# Patient Record
Sex: Male | Born: 1975 | Race: Black or African American | Hispanic: No | Marital: Married | Smoking: Current every day smoker
Health system: Southern US, Community
[De-identification: ages and names within clinical notes are randomized; demographics above are authoritative.]

## PROBLEM LIST (undated history)

## (undated) DIAGNOSIS — N200 Calculus of kidney: Secondary | ICD-10-CM

---

## 2007-04-02 ENCOUNTER — Emergency Department (HOSPITAL_COMMUNITY): Admission: EM | Admit: 2007-04-02 | Discharge: 2007-04-02 | Payer: Self-pay | Admitting: Emergency Medicine

## 2010-05-25 ENCOUNTER — Emergency Department (HOSPITAL_COMMUNITY)
Admission: EM | Admit: 2010-05-25 | Discharge: 2010-05-25 | Payer: Self-pay | Source: Home / Self Care | Admitting: Emergency Medicine

## 2011-03-27 ENCOUNTER — Encounter: Payer: Self-pay | Admitting: *Deleted

## 2011-03-27 ENCOUNTER — Emergency Department (HOSPITAL_COMMUNITY): Payer: Self-pay

## 2011-03-27 ENCOUNTER — Emergency Department (HOSPITAL_COMMUNITY)
Admission: EM | Admit: 2011-03-27 | Discharge: 2011-03-27 | Disposition: A | Discharge status: Home / Self Care | Payer: Self-pay | Attending: Emergency Medicine | Admitting: Emergency Medicine

## 2011-03-27 DIAGNOSIS — R109 Unspecified abdominal pain: Secondary | ICD-10-CM | POA: Insufficient documentation

## 2011-03-27 DIAGNOSIS — N23 Unspecified renal colic: Secondary | ICD-10-CM

## 2011-03-27 DIAGNOSIS — N201 Calculus of ureter: Secondary | ICD-10-CM | POA: Insufficient documentation

## 2011-03-27 HISTORY — DX: Calculus of kidney: N20.0

## 2011-03-27 LAB — URINALYSIS, ROUTINE W REFLEX MICROSCOPIC
Bilirubin Urine: NEGATIVE
Nitrite: NEGATIVE
Protein, ur: NEGATIVE mg/dL
Specific Gravity, Urine: 1.015 (ref 1.005–1.030)
Urobilinogen, UA: 1 mg/dL (ref 0.0–1.0)

## 2011-03-27 LAB — URINE MICROSCOPIC-ADD ON

## 2011-03-27 MED ORDER — KETOROLAC TROMETHAMINE 60 MG/2ML IM SOLN
60.0000 mg | Freq: Once | INTRAMUSCULAR | Status: AC
  Administered 2011-03-27: 60 mg via INTRAMUSCULAR
  Filled 2011-03-27: qty 2

## 2011-03-27 MED ORDER — OXYCODONE-ACETAMINOPHEN 5-325 MG PO TABS
2.0000 | ORAL_TABLET | Freq: Once | ORAL | Status: AC
  Administered 2011-03-27: 2 via ORAL
  Filled 2011-03-27: qty 2

## 2011-03-27 MED ORDER — OXYCODONE-ACETAMINOPHEN 5-325 MG PO TABS: 2.0000 | ORAL_TABLET | ORAL | Status: AC | PRN

## 2011-03-27 MED ORDER — CIPROFLOXACIN HCL 500 MG PO TABS: 500.0000 mg | ORAL_TABLET | Freq: Two times a day (BID) | ORAL | Status: AC

## 2011-03-27 NOTE — ED Notes (Signed)
Patient transported to CT 

## 2011-03-27 NOTE — ED Notes (Signed)
Medicated as ordered--Rates pain a 1 on 1-10 scale prior to meds.

## 2011-03-27 NOTE — ED Notes (Signed)
C/o rt flank, started yesterday, no urinary symptoms, no n/v

## 2011-03-27 NOTE — ED Provider Notes (Signed)
History     CSN: [id number] Arrival date & time: 03/27/2011 11:01 AM   First MD Initiated Contact with Patient 03/27/11 1115      Chief Complaint  Patient presents with  . Flank Pain    right flank pain    (Consider location/radiation/quality/duration/timing/severity/associated sxs/prior treatment) HPI Patient with intermittent left flank pain since yesterday.  Patient states pain is sharp 10/10, now 0/10.  Radiates from low back to flank.  Pain is intermittent last 5-7 minutes.  Patient with similar symptoms with kidney stone in 2007.  No hematuria, or frequency, with some hesitancy.  No nausea or vomiting, fever, diarrhea.     Past Medical History  Diagnosis Date  . Kidney stones     History reviewed. No pertinent past surgical history.  No family history on file.  History  Substance Use Topics  . Smoking status: Not on file  . Smokeless tobacco: Current User  . Alcohol Use: Yes      Review of Systems  All other systems reviewed and are negative.    Allergies  Review of patient's allergies indicates no known allergies.  Home Medications   Current Outpatient Rx  Name Route Sig Dispense Refill  . BISACODYL 5 MG PO TBEC Oral Take 5 mg by mouth daily as needed.      Marland Kitchen SIMETHICONE 125 MG PO CHEW Oral Chew 125 mg by mouth every 6 (six) hours as needed.        BP 124/76  Pulse 78  Temp(Src) 98.7 F (37.1 C) (Oral)  Resp 16  SpO2 99%  Physical Exam  Nursing note and vitals reviewed. Constitutional: He is oriented to person, place, and time. He appears well-developed and well-nourished.  HENT:  Head: Normocephalic and atraumatic.  Eyes: Conjunctivae and EOM are normal. Pupils are equal, round, and reactive to light.  Neck: Normal range of motion. Neck supple.  Cardiovascular: Normal rate and normal heart sounds.   Pulmonary/Chest: Effort normal and breath sounds normal.  Abdominal: Soft. Bowel sounds are normal.  Musculoskeletal: Normal range of motion.   Neurological: He is alert and oriented to person, place, and time.  Skin: Skin is warm and dry. No erythema.  Psychiatric: He has a normal mood and affect.    ED Course  Procedures (including critical care time)  Labs Reviewed - No data to display No results found.   No diagnosis found.    MDM     Patient remains pain free here.       Hilario Quarry, MD 03/27/11 [phone]

## 2011-07-04 ENCOUNTER — Emergency Department (HOSPITAL_COMMUNITY)
Admission: EM | Admit: 2011-07-04 | Discharge: 2011-07-04 | Disposition: A | Discharge status: Home / Self Care | Payer: Self-pay | Attending: Emergency Medicine | Admitting: Emergency Medicine

## 2011-07-04 ENCOUNTER — Encounter (HOSPITAL_COMMUNITY): Payer: Self-pay | Admitting: Adult Health

## 2011-07-04 DIAGNOSIS — F17200 Nicotine dependence, unspecified, uncomplicated: Secondary | ICD-10-CM | POA: Insufficient documentation

## 2011-07-04 DIAGNOSIS — J040 Acute laryngitis: Secondary | ICD-10-CM | POA: Insufficient documentation

## 2011-07-04 MED ORDER — PREDNISONE 20 MG PO TABS
60.0000 mg | ORAL_TABLET | Freq: Once | ORAL | Status: AC
  Administered 2011-07-04: 60 mg via ORAL
  Filled 2011-07-04: qty 3

## 2011-07-04 MED ORDER — HYDROCODONE-ACETAMINOPHEN 7.5-500 MG/15ML PO SOLN: 15.0000 mL | Freq: Four times a day (QID) | ORAL | Status: AC | PRN

## 2011-07-04 NOTE — ED Provider Notes (Signed)
Medical screening examination/treatment/procedure(s) were performed by non-physician practitioner and as supervising physician I was immediately available for consultation/collaboration.   Earlee Herald A Teesha Ohm, MD 07/04/11 1618 

## 2011-07-04 NOTE — Discharge Instructions (Signed)
Laryngitis At the top of your windpipe is your voice box. It is the source of your voice. Inside your voice box are 2 bands of muscles called vocal cords. When you breathe, your vocal cords are relaxed and open so that air can get into the lungs. When you decide to say something, these cords come together and vibrate. The sound from these vibrations goes into your throat and comes out through your mouth as sound. Laryngitis is an inflammation of the vocal cords that causes hoarseness, cough, loss of voice, sore throat, and dry throat. Laryngitis can often be related to a viral infection, excessive smoking, excessive talking or yelling, inhalation of toxic fumes, allergies, and reflux of acid from your stomach. CAUSES Laryngitis can be temporary (acute) or long-term (chronic). Most cases of acute laryngitis improve with time. The typical cause of acute laryngitis is viral infection, vocal strain, measles or mumps, or bacterial infections. Chronic laryngitis lasts for more than 3 weeks. It is usually caused by vocal cord strain, vocal cord injury, postnasal drip, growths on the vocal cords, or acid reflux. RISK FACTORS  Respiratory infections.   Exposure to irritating substances, such as cigarette smoke, excessive amounts of alcohol, stomach acids, and workplace chemicals.   Voice trauma, such as vocal cord injury from shouting or speaking too loud.  DIAGNOSIS  The most common sign of laryngitis is hoarseness. This can range from partial to total loss of your voice. Laryngoscopy may be necessary. This allows your caregiver to look into the larynx in order to diagnose your condition. HOME CARE INSTRUCTIONS  Drink enough fluids to keep your urine clear or pale yellow.   Rest until you no longer have symptoms or as directed by your caregiver.   Breathe in moist air.   Take all medicine as directed by your caregiver.   Do not smoke.   Talk as little as possible (this includes whispering).    Write on paper instead of talking until your voice is back to normal.   Follow up with your caregiver if your condition has not improved after 10 days.  SEEK MEDICAL CARE IF:   You have trouble breathing.   You cough up blood.   You have persistent fever.   You have increasing pain.   You have difficulty swallowing.  Document Released: 04/26/2005 Document Revised: 01/06/2011 Document Reviewed: 07/02/2010 [employer] Patient Information 2012 [employer]. 

## 2011-07-04 NOTE — ED Notes (Signed)
Presents with sore throat that began last, nausea and enlarged lymph nodes. Throat red, no exudate noted.

## 2011-07-04 NOTE — ED Provider Notes (Signed)
History     CSN: [id number]  Arrival date & time 07/04/11  1135   First MD Initiated Contact with Patient 07/04/11 1155      Chief Complaint  Patient presents with  . Sore Throat    (Consider location/radiation/quality/duration/timing/severity/associated sxs/prior treatment) HPI  36yo male presenting to the chief complaints of sore throat. Sore throat started last night. Patient also noticed nausea without vomiting or diarrhea.  States he was doing a lot of singing and yelling last night and consumed moderate amount of alcohol. This morning he has trouble swallowing.  Fever, sneezing, cough, ear pain, rash. He denies any recent trauma. He denies chest pain or shortness of breath. Any recent travel, or any change in medication.  Past Medical History  Diagnosis Date  . Kidney stones     History reviewed. No pertinent past surgical history.  History reviewed. No pertinent family history.  History  Substance Use Topics  . Smoking status: Current Everyday Smoker -- 0.5 packs/day for 15 years    Types: Cigarettes  . Smokeless tobacco: Current User  . Alcohol Use: Yes      Review of Systems  All other systems reviewed and are negative.    Allergies  Review of patient's allergies indicates no known allergies.  Home Medications   Current Outpatient Rx  Name Route Sig Dispense Refill  . BISACODYL 5 MG PO TBEC Oral Take 5 mg by mouth daily as needed.      Marland Kitchen SIMETHICONE 125 MG PO CHEW Oral Chew 125 mg by mouth every 6 (six) hours as needed.        BP 140/84  Pulse 92  Temp(Src) 98.1 F (36.7 C) (Oral)  Resp 22  SpO2 100%  Physical Exam  Nursing note and vitals reviewed. Constitutional: He appears well-developed and well-nourished. No distress.  HENT:  Head: Normocephalic and atraumatic. No trismus in the jaw.  Right Ear: External ear normal.  Left Ear: External ear normal.  Mouth/Throat: Uvula is midline and mucous membranes are normal. He does not have  dentures. Normal dentition. No dental abscesses, uvula swelling, lacerations or dental caries. Posterior oropharyngeal erythema present. No oropharyngeal exudate, posterior oropharyngeal edema or tonsillar abscesses.  Neck: Normal range of motion. Neck supple. No tracheal deviation present.  Cardiovascular: Normal rate and regular rhythm.   Pulmonary/Chest: Effort normal and breath sounds normal. No stridor. No respiratory distress. He has no wheezes. He has no rales. He exhibits no tenderness.  Abdominal: Soft. Bowel sounds are normal. He exhibits no distension. There is no tenderness.  Musculoskeletal: Normal range of motion.  Lymphadenopathy:    He has no cervical adenopathy.  Neurological: He is alert.  Skin: Skin is warm and dry.  Psychiatric: He has a normal mood and affect.    ED Course  Procedures (including critical care time)   Labs Reviewed  RAPID STREP SCREEN   No results found.   No diagnosis found.    MDM  Sore throat,  Likely secondary to recent alcohol use and yelling. Symptoms more consistent with laryngitis. No obvious evidence of peritonsillar abscess or Ludwig's angina.  Tonsil not enlarge, no exudates.    Strep test neg.  WIll discharge with lortab and f/u instruction.         Fayrene Helper, PA-C 07/04/11 1255

## 2011-12-27 ENCOUNTER — Encounter (HOSPITAL_COMMUNITY): Payer: Self-pay | Admitting: Emergency Medicine

## 2011-12-27 ENCOUNTER — Emergency Department (HOSPITAL_COMMUNITY)
Admission: EM | Admit: 2011-12-27 | Discharge: 2011-12-27 | Disposition: A | Discharge status: Home / Self Care | Payer: BC Managed Care – PPO | Attending: Emergency Medicine | Admitting: Emergency Medicine

## 2011-12-27 DIAGNOSIS — R220 Localized swelling, mass and lump, head: Secondary | ICD-10-CM | POA: Insufficient documentation

## 2011-12-27 DIAGNOSIS — R221 Localized swelling, mass and lump, neck: Secondary | ICD-10-CM

## 2011-12-27 DIAGNOSIS — F17200 Nicotine dependence, unspecified, uncomplicated: Secondary | ICD-10-CM | POA: Insufficient documentation

## 2011-12-27 NOTE — ED Provider Notes (Signed)
History     CSN: [id number]  Arrival date & time 12/27/11  Marcus Hardy   First MD Initiated Contact with Patient 12/27/11 [phone]      Chief Complaint  Patient presents with  . Knot on Neck     (Consider location/radiation/quality/duration/timing/severity/associated sxs/prior treatment) HPI Comments: Marcus Hardy 36 y.o. male   The chief complaint is: Patient presents with:   Knot on Neck    The patient has medical history significant for:   Past Medical History:   Kidney stones                                               Patient presents with a "knot" on his neck that he states has been there for 16 years. There are no associated symptoms. Patient came in today on the urging of his wife who told him this was not normal. Denies fever, chills, night sweats, or weight loss. Denies NVD or abdominal pain. Denies neck pain.      The history is provided by the patient.    Past Medical History  Diagnosis Date  . Kidney stones     History reviewed. No pertinent past surgical history.  No family history on file.  History  Substance Use Topics  . Smoking status: Current Everyday Smoker -- 0.5 packs/day for 15 years    Types: Cigarettes  . Smokeless tobacco: Current User  . Alcohol Use: Yes      Review of Systems  Constitutional: Negative for fever, diaphoresis and unexpected weight change.  HENT: Negative for neck pain.   Gastrointestinal: Negative for nausea, vomiting, abdominal pain and diarrhea.  All other systems reviewed and are negative.    Allergies  Review of patient's allergies indicates no known allergies.  Home Medications  No current outpatient prescriptions on file.  BP 129/81  Pulse 87  Temp 98 F (36.7 C)  Resp 16  Wt 160 lb (72.576 kg)  SpO2 99%  Physical Exam  Nursing note and vitals reviewed. Constitutional: He appears well-developed.  HENT:  Head: Normocephalic and atraumatic.  Mouth/Throat: Oropharynx is clear and moist.  Eyes:  Conjunctivae and EOM are normal. No scleral icterus.  Neck: Normal range of motion. Neck supple.         1cm reactive superficial cervical left node on the right side of his neck.  Cardiovascular: Normal rate, regular rhythm and normal heart sounds.   Pulmonary/Chest: Effort normal and breath sounds normal.  Abdominal: Soft. Bowel sounds are normal. There is no tenderness.  Lymphadenopathy:    He has cervical adenopathy.       Right cervical: Superficial cervical adenopathy present.  Neurological: He is alert.  Skin: Skin is warm and dry.    ED Course  Procedures (including critical care time)  Labs Reviewed - No data to display No results found.   1. Neck nodule       MDM  Patient presented for "knot" on the right side of his neck that had been present for 16 years. No other cervical, supraclavicular, or epitrochlear adenopathy appreciated. No tenderness or signs of infection. No red flags for lymphoma, abscess, folliculitis, or epidermal inclusion cyst. Patient discharged with instruction to follow-up with ENT if his still concerned. Patient given return precautions verbally and discharge summary.        Pixie Casino, PA-C 12/27/11 [phone]

## 2011-12-27 NOTE — ED Provider Notes (Signed)
Medical screening examination/treatment/procedure(s) were performed by non-physician practitioner and as supervising physician I was immediately available for consultation/collaboration.  Olivia Mackie, MD 12/27/11 [phone]
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
## 2011-12-31 ENCOUNTER — Emergency Department (HOSPITAL_COMMUNITY)
Admission: EM | Admit: 2011-12-31 | Discharge: 2011-12-31 | Disposition: A | Discharge status: Home / Self Care | Payer: BC Managed Care – PPO | Attending: Emergency Medicine | Admitting: Emergency Medicine

## 2011-12-31 ENCOUNTER — Encounter (HOSPITAL_COMMUNITY): Payer: Self-pay | Admitting: Emergency Medicine

## 2011-12-31 DIAGNOSIS — F17200 Nicotine dependence, unspecified, uncomplicated: Secondary | ICD-10-CM | POA: Insufficient documentation

## 2011-12-31 DIAGNOSIS — K029 Dental caries, unspecified: Secondary | ICD-10-CM | POA: Insufficient documentation

## 2011-12-31 DIAGNOSIS — K0889 Other specified disorders of teeth and supporting structures: Secondary | ICD-10-CM

## 2011-12-31 MED ORDER — PENICILLIN V POTASSIUM 500 MG PO TABS
500.0000 mg | ORAL_TABLET | Freq: Once | ORAL | Status: AC
  Administered 2011-12-31: 500 mg via ORAL
  Filled 2011-12-31: qty 1

## 2011-12-31 MED ORDER — OXYCODONE-ACETAMINOPHEN 5-325 MG PO TABS
1.0000 | ORAL_TABLET | Freq: Once | ORAL | Status: AC
  Administered 2011-12-31: 1 via ORAL
  Filled 2011-12-31: qty 1

## 2011-12-31 MED ORDER — OXYCODONE-ACETAMINOPHEN 5-325 MG PO TABS: 1.0000 | ORAL_TABLET | Freq: Four times a day (QID) | ORAL | Status: AC | PRN

## 2011-12-31 MED ORDER — PENICILLIN V POTASSIUM 500 MG PO TABS: 500.0000 mg | ORAL_TABLET | Freq: Three times a day (TID) | ORAL | Status: AC

## 2011-12-31 MED ORDER — ONDANSETRON 4 MG PO TBDP
4.0000 mg | ORAL_TABLET | Freq: Once | ORAL | Status: AC
  Administered 2011-12-31: 4 mg via ORAL
  Filled 2011-12-31: qty 1

## 2011-12-31 MED ORDER — ONDANSETRON HCL 4 MG PO TABS: 4.0000 mg | ORAL_TABLET | Freq: Four times a day (QID) | ORAL | Status: AC
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
## 2011-12-31 NOTE — ED Provider Notes (Signed)
History     CSN: [id number]  Arrival date & time 12/31/11  0152   First MD Initiated Contact with Patient 12/31/11 [phone]      Chief Complaint  Patient presents with  . Dental Pain    (Consider location/radiation/quality/duration/timing/severity/associated sxs/prior treatment) HPI  Patient presents to the emergency department with a dental complaint. Symptoms began two weeks ago and have been progressively getting worse. The patient has tried to alleviate pain with ibuprofen.  Pain rated at a 10/10, characterized as throbbing in nature and located right upper tooth. Patient denies fever, night sweats, chills, difficulty swallowing or opening mouth, SOB, nuchal rigidity or decreased ROM of neck.  Patient does not have a dentist and requests a resource guide at discharge.   Past Medical History  Diagnosis Date  . Kidney stones     History reviewed. No pertinent past surgical history.  Family History  Problem Relation Age of Onset  . Diabetes Other     History  Substance Use Topics  . Smoking status: Current Everyday Smoker -- 0.5 packs/day for 15 years    Types: Cigarettes  . Smokeless tobacco: Current User  . Alcohol Use: Yes     rare      Review of Systems  All other systems reviewed and are negative.    Allergies  Review of patient's allergies indicates no known allergies.  Home Medications   Current Outpatient Rx  Name Route Sig Dispense Refill  . IBUPROFEN 200 MG PO TABS Oral Take 200 mg by mouth every 6 (six) hours as needed.    Marland Kitchen ONDANSETRON HCL 4 MG PO TABS Oral Take 1 tablet (4 mg total) by mouth every 6 (six) hours. 12 tablet 0  . OXYCODONE-ACETAMINOPHEN 5-325 MG PO TABS Oral Take 1 tablet by mouth every 6 (six) hours as needed for pain. 15 tablet 0  . PENICILLIN V POTASSIUM 500 MG PO TABS Oral Take 1 tablet (500 mg total) by mouth 3 (three) times daily. 30 tablet 0    BP 118/79  Pulse 106  Temp 98.8 F (37.1 C) (Oral)  Resp 15  Ht 5\' 7"  (1.702  m)  Wt 160 lb (72.576 kg)  BMI 25.06 kg/m2  SpO2 97%  Physical Exam  Nursing note and vitals reviewed. Constitutional: He appears well-developed and well-nourished.  HENT:  Head: Normocephalic and atraumatic.  Mouth/Throat: Dental caries present.    Eyes: Conjunctivae and EOM are normal. Pupils are equal, round, and reactive to light.  Neck: Normal range of motion. Neck supple.  Cardiovascular: Normal rate and regular rhythm.   Pulmonary/Chest: Effort normal and breath sounds normal.    ED Course  Procedures (including critical care time)  Labs Reviewed - No data to display No results found.   1. Toothache       MDM  Pt given Rx for Percocets 5-325 (10 tabs) and Penicillin. Patient informed that they need to find a dentist and have the tooth pulled or the symptoms may be reoccurring. A Resource guide has been given with dental providers. Patient has been given return to ED precautions.         Dorthula Matas, PA 12/31/11 [phone]

## 2011-12-31 NOTE — ED Notes (Signed)
Pt states he thinks he has an abscessed tooth  Pt states he has a hole in his tooth and it has caused his other teeth to hurt and the right side of his face to hurt and the area in front of his right ear to hurt  Pt states his throat is sore and he has had a couple sharp pains to run up into his head

## 2012-01-01 NOTE — ED Provider Notes (Signed)
Medical screening examination/treatment/procedure(s) were performed by non-physician practitioner and as supervising physician I was immediately available for consultation/collaboration.   Kiwan Gadsden, MD 01/01/12 2303 

## 2012-07-17 ENCOUNTER — Emergency Department (HOSPITAL_COMMUNITY)
Admission: EM | Admit: 2012-07-17 | Discharge: 2012-07-17 | Disposition: A | Discharge status: Home / Self Care | Payer: BC Managed Care – PPO | Attending: Emergency Medicine | Admitting: Emergency Medicine

## 2012-07-17 ENCOUNTER — Encounter (HOSPITAL_COMMUNITY): Payer: Self-pay | Admitting: Emergency Medicine

## 2012-07-17 DIAGNOSIS — Y9389 Activity, other specified: Secondary | ICD-10-CM | POA: Insufficient documentation

## 2012-07-17 DIAGNOSIS — Z87442 Personal history of urinary calculi: Secondary | ICD-10-CM | POA: Insufficient documentation

## 2012-07-17 DIAGNOSIS — Y929 Unspecified place or not applicable: Secondary | ICD-10-CM | POA: Insufficient documentation

## 2012-07-17 DIAGNOSIS — Z23 Encounter for immunization: Secondary | ICD-10-CM | POA: Insufficient documentation

## 2012-07-17 DIAGNOSIS — F17200 Nicotine dependence, unspecified, uncomplicated: Secondary | ICD-10-CM | POA: Insufficient documentation

## 2012-07-17 DIAGNOSIS — IMO0002 Reserved for concepts with insufficient information to code with codable children: Secondary | ICD-10-CM | POA: Insufficient documentation

## 2012-07-17 MED ORDER — TETANUS-DIPHTH-ACELL PERTUSSIS 5-2.5-18.5 LF-MCG/0.5 IM SUSP
0.5000 mL | Freq: Once | INTRAMUSCULAR | Status: AC
  Administered 2012-07-17: 0.5 mL via INTRAMUSCULAR
  Filled 2012-07-17: qty 0.5
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
## 2012-07-17 NOTE — ED Notes (Signed)
MD at bedside. 

## 2012-07-17 NOTE — ED Notes (Signed)
Bacitracin and 2x2 bandage applied to right head area.
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
## 2012-07-19 NOTE — ED Provider Notes (Signed)
Medical screening examination/treatment/procedure(s) were performed by non-physician practitioner and as supervising physician I was immediately available for consultation/collaboration.   Lyanne Co, MD 07/19/12 [phone]

## 2013-03-27 ENCOUNTER — Emergency Department (HOSPITAL_COMMUNITY)
Admission: EM | Admit: 2013-03-27 | Discharge: 2013-03-27 | Disposition: A | Discharge status: Home / Self Care | Payer: BC Managed Care – PPO | Attending: Emergency Medicine | Admitting: Emergency Medicine

## 2013-03-27 ENCOUNTER — Encounter (HOSPITAL_COMMUNITY): Payer: Self-pay | Admitting: Emergency Medicine

## 2013-03-27 DIAGNOSIS — Y9389 Activity, other specified: Secondary | ICD-10-CM | POA: Insufficient documentation

## 2013-03-27 DIAGNOSIS — Y92410 Unspecified street and highway as the place of occurrence of the external cause: Secondary | ICD-10-CM | POA: Insufficient documentation

## 2013-03-27 DIAGNOSIS — S139XXA Sprain of joints and ligaments of unspecified parts of neck, initial encounter: Secondary | ICD-10-CM | POA: Insufficient documentation

## 2013-03-27 DIAGNOSIS — S161XXA Strain of muscle, fascia and tendon at neck level, initial encounter: Secondary | ICD-10-CM

## 2013-03-27 DIAGNOSIS — S335XXA Sprain of ligaments of lumbar spine, initial encounter: Secondary | ICD-10-CM | POA: Insufficient documentation

## 2013-03-27 DIAGNOSIS — Z87891 Personal history of nicotine dependence: Secondary | ICD-10-CM | POA: Insufficient documentation

## 2013-03-27 DIAGNOSIS — IMO0002 Reserved for concepts with insufficient information to code with codable children: Secondary | ICD-10-CM | POA: Diagnosis present

## 2013-03-27 DIAGNOSIS — S39012A Strain of muscle, fascia and tendon of lower back, initial encounter: Secondary | ICD-10-CM

## 2013-03-27 MED ORDER — HYDROCODONE-ACETAMINOPHEN 5-325 MG PO TABS
1.0000 | ORAL_TABLET | Freq: Once | ORAL | Status: AC
  Administered 2013-03-27: 1 via ORAL
  Filled 2013-03-27: qty 1

## 2013-03-27 MED ORDER — IBUPROFEN 800 MG PO TABS: 800.0000 mg | ORAL_TABLET | Freq: Three times a day (TID) | ORAL | Status: DC

## 2013-03-27 MED ORDER — DIAZEPAM 5 MG PO TABS: 5.0000 mg | ORAL_TABLET | Freq: Two times a day (BID) | ORAL | Status: DC

## 2013-03-27 MED ORDER — KETOROLAC TROMETHAMINE 60 MG/2ML IM SOLN
60.0000 mg | Freq: Once | INTRAMUSCULAR | Status: AC
  Administered 2013-03-27: 60 mg via INTRAMUSCULAR
  Filled 2013-03-27: qty 2

## 2013-03-27 MED ORDER — DIAZEPAM 5 MG PO TABS
5.0000 mg | ORAL_TABLET | Freq: Once | ORAL | Status: AC
  Administered 2013-03-27: 5 mg via ORAL
  Filled 2013-03-27: qty 1
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
## 2013-03-27 NOTE — ED Provider Notes (Signed)
Medical screening examination/treatment/procedure(s) were performed by non-physician practitioner and as supervising physician I was immediately available for consultation/collaboration.    Davonda Ausley M Loeta Herst, MD 03/27/13 0623 
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
## 2013-04-18 ENCOUNTER — Emergency Department (HOSPITAL_COMMUNITY)
Admission: EM | Admit: 2013-04-18 | Discharge: 2013-04-18 | Disposition: A | Discharge status: Home / Self Care | Payer: BC Managed Care – PPO | Attending: Emergency Medicine | Admitting: Emergency Medicine

## 2013-04-18 ENCOUNTER — Encounter (HOSPITAL_COMMUNITY): Payer: Self-pay | Admitting: Emergency Medicine

## 2013-04-18 DIAGNOSIS — F17200 Nicotine dependence, unspecified, uncomplicated: Secondary | ICD-10-CM | POA: Insufficient documentation

## 2013-04-18 DIAGNOSIS — Z87442 Personal history of urinary calculi: Secondary | ICD-10-CM | POA: Diagnosis not present

## 2013-04-18 DIAGNOSIS — R519 Headache, unspecified: Secondary | ICD-10-CM

## 2013-04-18 DIAGNOSIS — Z791 Long term (current) use of non-steroidal anti-inflammatories (NSAID): Secondary | ICD-10-CM | POA: Insufficient documentation

## 2013-04-18 DIAGNOSIS — R51 Headache: Secondary | ICD-10-CM | POA: Diagnosis present

## 2013-04-18 MED ORDER — TRAMADOL HCL 50 MG PO TABS: 50.0000 mg | ORAL_TABLET | Freq: Four times a day (QID) | ORAL | Status: DC | PRN

## 2013-04-18 NOTE — ED Provider Notes (Signed)
CSN: [id number]     Arrival date & time 04/18/13  1423 History   First MD Initiated Contact with Patient 04/18/13 1500     Chief Complaint  Patient presents with  . Headache   (Consider location/radiation/quality/duration/timing/severity/associated sxs/prior Treatment) HPI  37 y male with headache. Gradual onset approximately one week ago. Patient cannot remember what he is doing specifically when he first noticed the pain. Pain has been relatively constant. No appreciable exacerbating relieving factors. Denies any acute visual changes, numbness, tingling or loss of strength. No fevers or chills. No significant headache history. Has tried taking ibuprofen with only mild relief.  Past Medical History  Diagnosis Date  . Kidney stones    History reviewed. No pertinent past surgical history. Family History  Problem Relation Age of Onset  . Diabetes Other    History  Substance Use Topics  . Smoking status: Current Every Day Smoker -- 0.50 packs/day for 15 years    Types: Cigarettes  . Smokeless tobacco: Current User  . Alcohol Use: Yes     Comment: rare    Review of Systems  All systems reviewed and negative, other than as noted in HPI.   Allergies  Review of patient's allergies indicates no known allergies.  Home Medications   Current Outpatient Rx  Name  Route  Sig  Dispense  Refill  . ibuprofen (ADVIL,MOTRIN) 800 MG tablet   Oral   Take 1 tablet (800 mg total) by mouth 3 (three) times daily.   12 tablet   0    BP 123/65  Pulse 93  Temp(Src) 98.1 F (36.7 C) (Oral)  Resp 20  SpO2 98% Physical Exam  Nursing note and vitals reviewed. Constitutional: He is oriented to person, place, and time. He appears well-developed and well-nourished. No distress.  HENT:  Head: Normocephalic and atraumatic.  Eyes: Conjunctivae are normal. Right eye exhibits no discharge. Left eye exhibits no discharge.  Neck: Neck supple.  No nuchal rigidity  Cardiovascular: Normal rate,  regular rhythm and normal heart sounds.  Exam reveals no gallop and no friction rub.   No murmur heard. Pulmonary/Chest: Effort normal and breath sounds normal. No respiratory distress.  Abdominal: Soft. He exhibits no distension. There is no tenderness.  Musculoskeletal: He exhibits no edema and no tenderness.  Neurological: He is alert and oriented to person, place, and time. No cranial nerve deficit. He exhibits normal muscle tone. Coordination normal.  Good finger nose testing bilaterally. Gait is steady.  Skin: Skin is warm and dry.  Psychiatric: He has a normal mood and affect. His behavior is normal. Thought content normal.    ED Course  Procedures (including critical care time)  Procedure Note:  Trigger Point Injection ( 2 point) - Bilateral Lower Cervical Intramuscular Injection  Indication: Headache  Consent: Informed verbal consent obtained. Detailed procedure including risks/benefits including but not limited to:   Soreness.  Bruising.  Stiffness.  More serious problems are rare. But, they may include:  Bleeding under the skin (hematoma).  Skin infection.  Breaking off of the needle under your skin.  Lung puncture.  The trigger point injection may not work for you.  Contraindications: none noted  Positioning: seated upright  Procedure: Area was cleaned with alcohol. B/l paracervical musculature at C 6/7 level was injected using C7 spinous process as landmark.  1.5cc 0.5% bupavicaine and 2% lidocaine without epinephrine mixture with injected with 1.5 inch 25 g needle after making a small wheel and using distracting digital pressure.  Needle directed slightly cephalad from parallel to floor. Aspirated prior to injection. Pt tolerated well with no apparent immediate complication.   Labs Review Labs Reviewed - No data to display Imaging Review No results found.  EKG Interpretation   None       MDM   1. Headache    37 year old male with headache. Low  suspicion for emergent etiology. No trauma. No fevers or chills. No signs of meningismus. Patient was given a paracervical muscular injection with some relief of her symptoms. Additional symptomatic treatment with tramadol. Return precautions were discussed. Outpatient followup otherwise.   Raeford Razor, MD 04/25/13 2034

## 2013-04-18 NOTE — ED Notes (Signed)
Headache x 1 wk; neck pain; can bend chin to chest and shoulders without difficulty; no fever

## 2013-12-05 ENCOUNTER — Encounter (HOSPITAL_COMMUNITY): Payer: Self-pay | Admitting: Emergency Medicine

## 2013-12-05 ENCOUNTER — Emergency Department (HOSPITAL_COMMUNITY)
Admission: EM | Admit: 2013-12-05 | Discharge: 2013-12-05 | Disposition: A | Discharge status: Home / Self Care | Payer: BC Managed Care – PPO | Attending: Emergency Medicine | Admitting: Emergency Medicine

## 2013-12-05 ENCOUNTER — Emergency Department (HOSPITAL_COMMUNITY): Payer: BC Managed Care – PPO

## 2013-12-05 DIAGNOSIS — W503XXA Accidental bite by another person, initial encounter: Secondary | ICD-10-CM

## 2013-12-05 DIAGNOSIS — Z87442 Personal history of urinary calculi: Secondary | ICD-10-CM | POA: Insufficient documentation

## 2013-12-05 DIAGNOSIS — T07XXXA Unspecified multiple injuries, initial encounter: Secondary | ICD-10-CM

## 2013-12-05 DIAGNOSIS — S51809A Unspecified open wound of unspecified forearm, initial encounter: Secondary | ICD-10-CM | POA: Insufficient documentation

## 2013-12-05 DIAGNOSIS — S21109A Unspecified open wound of unspecified front wall of thorax without penetration into thoracic cavity, initial encounter: Secondary | ICD-10-CM | POA: Insufficient documentation

## 2013-12-05 DIAGNOSIS — F17200 Nicotine dependence, unspecified, uncomplicated: Secondary | ICD-10-CM | POA: Insufficient documentation

## 2013-12-05 MED ORDER — OXYCODONE-ACETAMINOPHEN 5-325 MG PO TABS
1.0000 | ORAL_TABLET | Freq: Once | ORAL | Status: AC
  Administered 2013-12-05: 1 via ORAL
  Filled 2013-12-05: qty 1

## 2013-12-05 MED ORDER — IBUPROFEN 800 MG PO TABS: 800.0000 mg | ORAL_TABLET | Freq: Three times a day (TID) | ORAL | Status: DC | PRN

## 2013-12-05 MED ORDER — TETANUS-DIPHTH-ACELL PERTUSSIS 5-2.5-18.5 LF-MCG/0.5 IM SUSP: 0.5000 mL | Freq: Once | INTRAMUSCULAR | Status: DC

## 2013-12-05 MED ORDER — HYDROCODONE-ACETAMINOPHEN 5-325 MG PO TABS: 1.0000 | ORAL_TABLET | ORAL | Status: AC | PRN

## 2013-12-05 MED ORDER — AMOXICILLIN-POT CLAVULANATE 875-125 MG PO TABS: 1.0000 | ORAL_TABLET | Freq: Two times a day (BID) | ORAL | Status: DC

## 2013-12-05 NOTE — ED Provider Notes (Signed)
Medical screening examination/treatment/procedure(s) were performed by non-physician practitioner and as supervising physician I was immediately available for consultation/collaboration.   EKG Interpretation None       Kenn Rekowski M Jahel Wavra, MD 12/05/13 0656 

## 2013-12-05 NOTE — Discharge Instructions (Signed)
Read the information below.  Use the prescribed medication as directed.  Please discuss all new medications with your pharmacist.  Do not take additional tylenol while taking the prescribed pain medication to avoid overdose.  You may return to the Emergency Department at any time for worsening condition or any new symptoms that concern you.  If you develop redness, swelling, pus draining from the wound, or fevers greater than 100.4, return to the ER immediately for a recheck.    You have been diagnosed by your caregiver as having chest wall pain. SEEK IMMEDIATE MEDICAL ATTENTION IF: You develop a fever.  Your chest pains become severe or intolerable.  You develop new, unexplained symptoms (problems).  You develop shortness of breath, nausea, vomiting, sweating or feel light headed.  You develop a new cough or you cough up blood.  Human Bite Human bite wounds tend to become infected, even when they seem minor at first. Bite wounds of the hand can be serious because the tendons and joints are close to the skin. Infection can develop very rapidly, even in a matter of hours.  DIAGNOSIS  Your caregiver will most likely:  Take a detailed history of the bite injury.  Perform a wound exam.  Take your medical history. Blood tests or X-rays may be performed. Sometimes, infected bite wounds are cultured and sent to a lab to identify the infectious bacteria. TREATMENT  Medical treatment will depend on the location of the bite as well as the patient's medical history. Treatment may include:  Wound care, such as cleaning and flushing the wound with saline solution, bandaging, and elevating the affected area.  Antibiotic medicine.  Tetanus immunization.  Leaving the wound open to heal. This is often done with human bites due to the high risk of infection. However, in certain cases, wound closure with stitches, wound adhesive, skin adhesive strips, or staples may be used. Infected bites that are left  untreated may require intravenous (IV) antibiotics and surgical treatment in the hospital. HOME CARE INSTRUCTIONS  Follow your caregiver's instructions for wound care.  Take all medicines as directed.  If your caregiver prescribes antibiotics, take them as directed. Finish them even if you start to feel better.  Follow up with your caregiver for further exams or immunizations as directed. You may need a tetanus shot if:  You cannot remember when you had your last tetanus shot.  You have never had a tetanus shot.  The injury broke your skin. If you get a tetanus shot, your arm may swell, get red, and feel warm to the touch. This is common and not a problem. If you need a tetanus shot and you choose not to have one, there is a rare chance of getting tetanus. Sickness from tetanus can be serious. SEEK IMMEDIATE MEDICAL CARE IF:  You have increased pain, swelling, or redness around the bite wound.  You have chills.  You have a fever.  You have pus draining from the wound.  You have red streaks on the skin coming from the wound.  You have pain with movement or trouble moving the injured part.  You are not improving, or you are getting worse.  You have any other questions or concerns. MAKE SURE YOU:  Understand these instructions.  Will watch your condition.  Will get help right away if you are not doing well or get worse. Document Released: 06/03/2004 Document Revised: 07/19/2011 Document Reviewed: 12/16/2010 [facility] Patient Information 2015 [place] [place]. This information is not intended to  replace advice given to you by your health care provider. Make sure you discuss any questions you have with your health care provider.  Contusion A contusion is a deep bruise. Contusions are the result of an injury that caused bleeding under the skin. The contusion may turn blue, purple, or yellow. Minor injuries will give you a painless contusion, but more severe contusions may stay  painful and swollen for a few weeks.  CAUSES  A contusion is usually caused by a blow, trauma, or direct force to an area of the body. SYMPTOMS   Swelling and redness of the injured area.  Bruising of the injured area.  Tenderness and soreness of the injured area.  Pain. DIAGNOSIS  The diagnosis can be made by taking a history and physical exam. An X-ray, CT scan, or MRI may be needed to determine if there were any associated injuries, such as fractures. TREATMENT  Specific treatment will depend on what area of the body was injured. In general, the best treatment for a contusion is resting, icing, elevating, and applying cold compresses to the injured area. Over-the-counter medicines may also be recommended for pain control. Ask your caregiver what the best treatment is for your contusion. HOME CARE INSTRUCTIONS   Put ice on the injured area.  Put ice in a plastic bag.  Place a towel between your skin and the bag.  Leave the ice on for 15-20 minutes, 3-4 times a day, or as directed by your health care provider.  Only take over-the-counter or prescription medicines for pain, discomfort, or fever as directed by your caregiver. Your caregiver may recommend avoiding anti-inflammatory medicines (aspirin, ibuprofen, and naproxen) for 48 hours because these medicines may increase bruising.  Rest the injured area.  If possible, elevate the injured area to reduce swelling. SEEK IMMEDIATE MEDICAL CARE IF:   You have increased bruising or swelling.  You have pain that is getting worse.  Your swelling or pain is not relieved with medicines. MAKE SURE YOU:   Understand these instructions.  Will watch your condition.  Will get help right away if you are not doing well or get worse. Document Released: 02/03/2005 Document Revised: 05/01/2013 Document Reviewed: 03/01/2011 [facility] Patient Information 2015 [place], [place]. This information is not intended to replace advice given to you by  your health care provider. Make sure you discuss any questions you have with your health care provider.

## 2013-12-05 NOTE — ED Notes (Signed)
Pt had TDap 07/17/2012

## 2013-12-05 NOTE — ED Notes (Signed)
Patient reports that his tetanus is up to date.
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
## 2013-12-08 NOTE — ED Provider Notes (Signed)
Medical screening examination/treatment/procedure(s) were performed by non-physician practitioner and as supervising physician I was immediately available for consultation/collaboration.   EKG Interpretation None       Olivia Mackielga M Teshawn Moan, MD 12/08/13 [phone]

## 2014-03-27 ENCOUNTER — Encounter (HOSPITAL_COMMUNITY): Payer: Self-pay | Admitting: *Deleted

## 2014-03-27 ENCOUNTER — Emergency Department (HOSPITAL_COMMUNITY): Payer: BC Managed Care – PPO

## 2014-03-27 ENCOUNTER — Emergency Department (HOSPITAL_COMMUNITY)
Admission: EM | Admit: 2014-03-27 | Discharge: 2014-03-27 | Disposition: A | Discharge status: Home / Self Care | Payer: BC Managed Care – PPO | Attending: Emergency Medicine | Admitting: Emergency Medicine

## 2014-03-27 DIAGNOSIS — R030 Elevated blood-pressure reading, without diagnosis of hypertension: Secondary | ICD-10-CM | POA: Diagnosis not present

## 2014-03-27 DIAGNOSIS — R109 Unspecified abdominal pain: Secondary | ICD-10-CM

## 2014-03-27 DIAGNOSIS — Z720 Tobacco use: Secondary | ICD-10-CM | POA: Insufficient documentation

## 2014-03-27 DIAGNOSIS — N1330 Unspecified hydronephrosis: Secondary | ICD-10-CM | POA: Insufficient documentation

## 2014-03-27 DIAGNOSIS — N200 Calculus of kidney: Secondary | ICD-10-CM

## 2014-03-27 DIAGNOSIS — R1031 Right lower quadrant pain: Secondary | ICD-10-CM | POA: Diagnosis present

## 2014-03-27 LAB — BASIC METABOLIC PANEL
ANION GAP: 14 (ref 5–15)
BUN: 14 mg/dL (ref 6–23)
CALCIUM: 9.3 mg/dL (ref 8.4–10.5)
CHLORIDE: 103 meq/L (ref 96–112)
CO2: 26 meq/L (ref 19–32)
Creatinine, Ser: 1.05 mg/dL (ref 0.50–1.35)
GFR calc Af Amer: >90 mL/min (ref >90)
GFR calc non Af Amer: 89 mL/min — ABNORMAL LOW (ref >90)
Glucose, Bld: 119 mg/dL — ABNORMAL HIGH (ref 70–99)
Potassium: 4.6 mEq/L (ref 3.7–5.3)
SODIUM: 143 meq/L (ref 137–147)

## 2014-03-27 LAB — URINALYSIS, ROUTINE W REFLEX MICROSCOPIC
Bilirubin Urine: NEGATIVE
Glucose, UA: NEGATIVE mg/dL
KETONES UR: NEGATIVE mg/dL
NITRITE: NEGATIVE
PH: 6 (ref 5.0–8.0)
Protein, ur: NEGATIVE mg/dL
SPECIFIC GRAVITY, URINE: 1.023 (ref 1.005–1.030)
Urobilinogen, UA: 1 mg/dL (ref 0.0–1.0)

## 2014-03-27 LAB — CBC WITH DIFFERENTIAL/PLATELET
BASOS ABS: 0.1 10*3/uL (ref 0.0–0.1)
Basophils Relative: 1 % (ref 0–1)
Eosinophils Absolute: 0.4 10*3/uL (ref 0.0–0.7)
Eosinophils Relative: 4 % (ref 0–5)
HCT: 39.9 % (ref 39.0–52.0)
Hemoglobin: 13.5 g/dL (ref 13.0–17.0)
LYMPHS PCT: 54 % — AB (ref 12–46)
Lymphs Abs: 5.6 10*3/uL — ABNORMAL HIGH (ref 0.7–4.0)
MCH: 27.8 pg (ref 26.0–34.0)
MCHC: 33.8 g/dL (ref 30.0–36.0)
MCV: 82.1 fL (ref 78.0–100.0)
Monocytes Absolute: 0.9 10*3/uL (ref 0.1–1.0)
Monocytes Relative: 8 % (ref 3–12)
NEUTROS ABS: 3.4 10*3/uL (ref 1.7–7.7)
Neutrophils Relative %: 33 % — ABNORMAL LOW (ref 43–77)
PLATELETS: 233 10*3/uL (ref 150–400)
RBC: 4.86 MIL/uL (ref 4.22–5.81)
RDW: 13.2 % (ref 11.5–15.5)
WBC: 10.3 10*3/uL (ref 4.0–10.5)

## 2014-03-27 LAB — URINE MICROSCOPIC-ADD ON

## 2014-03-27 MED ORDER — KETOROLAC TROMETHAMINE 30 MG/ML IJ SOLN
30.0000 mg | Freq: Once | INTRAMUSCULAR | Status: AC
  Administered 2014-03-27: 30 mg via INTRAVENOUS
  Filled 2014-03-27: qty 1

## 2014-03-27 MED ORDER — TAMSULOSIN HCL 0.4 MG PO CAPS: 0.4000 mg | ORAL_CAPSULE | Freq: Every day | ORAL | Status: AC

## 2014-03-27 MED ORDER — SODIUM CHLORIDE 0.9 % IV BOLUS (SEPSIS): 1000.0000 mL | Freq: Once | INTRAVENOUS | Status: DC

## 2014-03-27 MED ORDER — OXYCODONE-ACETAMINOPHEN 5-325 MG PO TABS: 1.0000 | ORAL_TABLET | ORAL | Status: AC | PRN

## 2014-03-27 MED ORDER — HYDROMORPHONE HCL 1 MG/ML IJ SOLN
1.0000 mg | Freq: Once | INTRAMUSCULAR | Status: AC
  Administered 2014-03-27: 1 mg via INTRAVENOUS
  Filled 2014-03-27: qty 1

## 2014-03-27 MED ORDER — ONDANSETRON HCL 4 MG/2ML IJ SOLN
4.0000 mg | Freq: Once | INTRAMUSCULAR | Status: AC
  Administered 2014-03-27: 4 mg via INTRAVENOUS
  Filled 2014-03-27: qty 2

## 2014-03-27 NOTE — ED Notes (Signed)
Patient writhing around in the bed, yelling.  He reports pain is similar to prior kidney stones.

## 2014-03-27 NOTE — ED Notes (Signed)
Pt states that the he woke up this am with rt flank pain; pt states that he has a hx of kidney stones and that this feels the same

## 2014-03-27 NOTE — ED Provider Notes (Signed)
CSN: [id number]     Arrival date & time 03/27/14  [id number] History   First MD Initiated Contact with Patient 03/27/14 [phone]     Chief Complaint  Patient presents with  . Nephrolithiasis     (Consider location/radiation/quality/duration/timing/severity/associated sxs/prior Treatment) HPI Comments: The patient is a 38 year old male with past medical history of nephrolithiasis presenting to the emergency room chief complaint of right-sided flank pain since this morning. Patient reports sharp, cramping discomfort in right lower quadrant with radiation into the right flank. He reports similar symptoms with previous renal calculi. Does not follow with urologist, last kidney stone several years ago. He reports associated nausea without emesis, and diarrhea. Denies fever, chills, hematuria, dysuria.  The history is provided by the patient. No language interpreter was used.    Past Medical History  Diagnosis Date  . Kidney stones    History reviewed. No pertinent past surgical history. Family History  Problem Relation Age of Onset  . Diabetes Other    History  Substance Use Topics  . Smoking status: Current Every Day Smoker -- 0.50 packs/day for 15 years    Types: Cigarettes  . Smokeless tobacco: Current User  . Alcohol Use: Yes     Comment: rare    Review of Systems  Constitutional: Negative for fever and chills.  Gastrointestinal: Positive for nausea, abdominal pain and diarrhea. Negative for vomiting and constipation.  Genitourinary: Positive for flank pain. Negative for dysuria and hematuria.      Allergies  Review of patient's allergies indicates no known allergies.  Home Medications   Prior to Admission medications   Medication Sig Start Date End Date Taking? Authorizing Provider  amoxicillin-clavulanate (AUGMENTIN) 875-125 MG per tablet Take 1 tablet by mouth every 12 (twelve) hours. 12/05/13   Trixie DredgeEmily West, PA-C  HYDROcodone-acetaminophen (NORCO/VICODIN) 5-325 MG per tablet  Take 1-2 tablets by mouth every 4 (four) hours as needed for moderate pain or severe pain. 12/05/13   Trixie DredgeEmily West, PA-C  ibuprofen (ADVIL,MOTRIN) 800 MG tablet Take 1 tablet (800 mg total) by mouth every 8 (eight) hours as needed for mild pain or moderate pain. 12/05/13   Trixie DredgeEmily West, PA-C   BP 182/103 mmHg  Pulse 63  Temp(Src) 97.5 F (36.4 C) (Oral)  Resp 20  Ht 5\' 7"  (1.702 m)  Wt 180 lb (81.647 kg)  BMI 28.19 kg/m2  SpO2 100% Physical Exam  Constitutional: He is oriented to person, place, and time. He appears well-developed and well-nourished.  Non-toxic appearance. He does not have a sickly appearance. He does not appear ill. No distress.  Appears uncomfortable  HENT:  Head: Normocephalic and atraumatic.  Mouth/Throat: Oropharynx is clear and moist.  Eyes: EOM are normal. Pupils are equal, round, and reactive to light. No scleral icterus.  Neck: Normal range of motion. Neck supple.  Cardiovascular: Normal rate and regular rhythm.   Pulmonary/Chest: Effort normal and breath sounds normal. No respiratory distress. He has no wheezes. He has no rales.  Abdominal: Soft. Normal appearance. He exhibits no distension. There is tenderness in the right lower quadrant. There is tenderness at McBurney's point. There is no rigidity, no rebound, no guarding and no CVA tenderness.  Musculoskeletal: Normal range of motion.  Neurological: He is alert and oriented to person, place, and time.  Skin: Skin is warm and dry. He is not diaphoretic.  Psychiatric: He has a normal mood and affect. His behavior is normal.  Nursing note and vitals reviewed.   ED Course  Procedures (including critical  care time) Labs Review Labs Reviewed  CBC WITH DIFFERENTIAL - Abnormal; Notable for the following:    Neutrophils Relative % 33 (*)    Lymphocytes Relative 54 (*)    Lymphs Abs 5.6 (*)    All other components within normal limits  BASIC METABOLIC PANEL - Abnormal; Notable for the following:    Glucose, Bld  119 (*)    GFR calc non Af Amer 89 (*)    All other components within normal limits  URINALYSIS, ROUTINE W REFLEX MICROSCOPIC - Abnormal; Notable for the following:    APPearance TURBID (*)    Hgb urine dipstick LARGE (*)    Leukocytes, UA SMALL (*)    All other components within normal limits  URINE MICROSCOPIC-ADD ON    Imaging Review Ct Renal Stone Study  03/27/2014   CLINICAL DATA:  Right flank pain.  EXAM: CT ABDOMEN AND PELVIS WITHOUT CONTRAST  TECHNIQUE: Multidetector CT imaging of the abdomen and pelvis was performed following the standard protocol without IV contrast.  COMPARISON:  03/27/2011  FINDINGS: There is a 4 mm stone in the right ureter up approximately 5 cm proximal to the ureterovesical junction creating slight right hydronephrosis. There is a 3 mm stone in the upper pole of the otherwise normal left kidney.  There is a 15 mm cyst in the anterior aspect of the left lobe of the liver, increased in size since the prior study. There are 2 low-density lesions in the liver, 16 mm in the posterior aspect of the right lobe and 19 mm in the lateral aspect of the left lobe anteriorly. These have increased in size since 2012 and probably represent benign hemangiomata.  The liver is otherwise normal. Biliary tree, spleen, pancreas, adrenal glands, and bowel appear normal including the terminal ileum and appendix.  No adenopathy or free air free fluid.  Bladder and prostate gland appear normal. No acute osseous abnormality. 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  IMPRESSION: 1. 4 mm stone in the distal right ureter creating mild right hydronephrosis. 2. Multiple liver lesions, all increased in size since 2012. I think this represents a combination of hemangiomata and a benign liver cyst. The slow slight growth of the lesions is consistent with benign disease. This could be confirmed by a liver ultrasound. 3. Tiny stone in the upper pole of the otherwise normal left hip.   Electronically Signed    By: Geanie Cooley M.D.   On: 03/27/2014 07:08     EKG Interpretation None      MDM   Final diagnoses:  Abdominal pain  Right nephrolithiasis  Hydronephrosis of right kidney  Elevated blood pressure reading  patient presents with right flank pain, likely stone vs appendicitis given tenderness on exam. Labs, CT ordered. CT shows a 4 mm stone distal right ureter with mild right hydronephrosis. 0715 Re-eval pt reports moderate resolution of symptoms. Pt appears more comfortable.  Discussed CT results with the patient. Meds given in ED:  Medications  HYDROmorphone (DILAUDID) injection 1 mg (1 mg Intravenous Given 03/27/14 0635)  ondansetron (ZOFRAN) injection 4 mg (4 mg Intravenous Given 03/27/14 0637)  ketorolac (TORADOL) 30 MG/ML injection 30 mg (30 mg Intravenous Given 03/27/14 0803)    Discharge Medication List as of 03/27/2014  8:46 AM    START taking these medications   Details  oxyCODONE-acetaminophen (PERCOCET) 5-325 MG per tablet Take 1 tablet by mouth every 4 (four) hours as needed., Starting 03/27/2014, Until Discontinued, Print    tamsulosin (FLOMAX) 0.4  MG CAPS capsule Take 1 capsule (0.4 mg total) by mouth daily. Stop medication after successful expulsion of stone., Starting 03/27/2014, Until Discontinued, Print        Mellody DrownLauren Barrie Wale, PA-C 03/27/14 1640  Lyanne CoKevin M Campos, MD 03/28/14 [phone]

## 2014-03-27 NOTE — ED Notes (Signed)
Back in room following CT.  Patient reminded to notify staff when he feels he can urinate.  Understanding verbalized.

## 2014-03-27 NOTE — ED Notes (Signed)
Patient transported to CT 

## 2014-03-27 NOTE — Discharge Instructions (Signed)
Call for a follow up appointment with a Family or Primary Care Provider.  Call a urologist for further evaluation of your kidney stone. Return if Symptoms worsen.   Take medication as prescribed.

## 2015-04-22 ENCOUNTER — Encounter (HOSPITAL_COMMUNITY): Payer: Self-pay | Admitting: *Deleted

## 2015-04-22 ENCOUNTER — Emergency Department (HOSPITAL_COMMUNITY)
Admission: EM | Admit: 2015-04-22 | Discharge: 2015-04-22 | Disposition: A | Discharge status: Home / Self Care | Payer: Self-pay | Attending: Emergency Medicine | Admitting: Emergency Medicine

## 2015-04-22 DIAGNOSIS — F17210 Nicotine dependence, cigarettes, uncomplicated: Secondary | ICD-10-CM | POA: Insufficient documentation

## 2015-04-22 DIAGNOSIS — H53149 Visual discomfort, unspecified: Secondary | ICD-10-CM | POA: Insufficient documentation

## 2015-04-22 DIAGNOSIS — K0889 Other specified disorders of teeth and supporting structures: Secondary | ICD-10-CM | POA: Insufficient documentation

## 2015-04-22 DIAGNOSIS — Z791 Long term (current) use of non-steroidal anti-inflammatories (NSAID): Secondary | ICD-10-CM | POA: Insufficient documentation

## 2015-04-22 DIAGNOSIS — Z87442 Personal history of urinary calculi: Secondary | ICD-10-CM | POA: Insufficient documentation

## 2015-04-22 DIAGNOSIS — R51 Headache: Secondary | ICD-10-CM | POA: Insufficient documentation

## 2015-04-22 DIAGNOSIS — K029 Dental caries, unspecified: Secondary | ICD-10-CM | POA: Insufficient documentation

## 2015-04-22 DIAGNOSIS — R519 Headache, unspecified: Secondary | ICD-10-CM

## 2015-04-22 LAB — BASIC METABOLIC PANEL
Anion gap: 6 (ref 5–15)
BUN: 11 mg/dL (ref 6–20)
CO2: 27 mmol/L (ref 22–32)
Calcium: 9.2 mg/dL (ref 8.9–10.3)
Chloride: 108 mmol/L (ref 101–111)
Creatinine, Ser: 0.99 mg/dL (ref 0.61–1.24)
GFR calc Af Amer: >60 mL/min (ref >60)
GFR calc non Af Amer: >60 mL/min (ref >60)
Glucose, Bld: 107 mg/dL — ABNORMAL HIGH (ref 65–99)
Potassium: 4.7 mmol/L (ref 3.5–5.1)
Sodium: 141 mmol/L (ref 135–145)

## 2015-04-22 LAB — CBC
HCT: 39 % (ref 39.0–52.0)
Hemoglobin: 13 g/dL (ref 13.0–17.0)
MCH: 27.6 pg (ref 26.0–34.0)
MCHC: 33.3 g/dL (ref 30.0–36.0)
MCV: 82.8 fL (ref 78.0–100.0)
PLATELETS: 204 10*3/uL (ref 150–400)
RBC: 4.71 MIL/uL (ref 4.22–5.81)
RDW: 13 % (ref 11.5–15.5)
WBC: 5.3 10*3/uL (ref 4.0–10.5)

## 2015-04-22 MED ORDER — DEXAMETHASONE SODIUM PHOSPHATE 10 MG/ML IJ SOLN
10.0000 mg | Freq: Once | INTRAMUSCULAR | Status: AC
  Administered 2015-04-22: 10 mg via INTRAVENOUS
  Filled 2015-04-22: qty 1

## 2015-04-22 MED ORDER — DIPHENHYDRAMINE HCL 50 MG/ML IJ SOLN
25.0000 mg | Freq: Once | INTRAMUSCULAR | Status: AC
  Administered 2015-04-22: 25 mg via INTRAVENOUS
  Filled 2015-04-22: qty 1

## 2015-04-22 MED ORDER — IBUPROFEN 800 MG PO TABS: 800.0000 mg | ORAL_TABLET | Freq: Three times a day (TID) | ORAL | Status: AC

## 2015-04-22 MED ORDER — SODIUM CHLORIDE 0.9 % IV BOLUS (SEPSIS)
1000.0000 mL | Freq: Once | INTRAVENOUS | Status: AC
Start: 2015-04-22 — End: 2015-04-22
  Administered 2015-04-22: 1000 mL via INTRAVENOUS

## 2015-04-22 MED ORDER — METOCLOPRAMIDE HCL 5 MG/ML IJ SOLN
5.0000 mg | Freq: Once | INTRAMUSCULAR | Status: AC
  Administered 2015-04-22: 5 mg via INTRAVENOUS
  Filled 2015-04-22: qty 2

## 2015-04-22 MED ORDER — PENICILLIN V POTASSIUM 500 MG PO TABS: 500.0000 mg | ORAL_TABLET | Freq: Four times a day (QID) | ORAL | Status: AC

## 2015-04-22 NOTE — ED Provider Notes (Signed)
CSN: [id number]     Arrival date & time 04/22/15  [id number] History   First MD Initiated Contact with Patient 04/22/15 [phone]     Chief Complaint  Patient presents with  . Headache     (Consider location/radiation/quality/duration/timing/severity/associated sxs/prior Treatment) HPI   Marcus Hardy is a 39 y.o. male with no significant PMH who presents with 3 day history of gradual onset, constant, moderate throbbing occipital/bilateral temporal headache.  No head injury or trauma.  No thunderclap.  Associated symptom include photophobia.  He also complains of bilateral upper dental pain that began around the same time.  No modifying factors.  No meds tried PTA.  Aggravating factors include hot and cold beverages.  Denies CP, SOB, N/V, slurred speech, facial droop, unilateral weakness, visual disturbances, fever, neck stiffness, facial swelling, or drooling.  No PCP or dentist.    Past Medical History  Diagnosis Date  . Kidney stones    History reviewed. No pertinent past surgical history. Family History  Problem Relation Age of Onset  . Diabetes Other    Social History  Substance Use Topics  . Smoking status: Current Every Day Smoker -- 0.50 packs/day for 15 years    Types: Cigarettes  . Smokeless tobacco: Current User  . Alcohol Use: Yes     Comment: 1/2 pint daily    Review of Systems All other systems negative unless otherwise stated in HPI   Allergies  Vicodin  Home Medications   Prior to Admission medications   Medication Sig Start Date End Date Taking? Authorizing Provider  HYDROcodone-acetaminophen (NORCO/VICODIN) 5-325 MG per tablet Take 1-2 tablets by mouth every 4 (four) hours as needed for moderate pain or severe pain. Patient not taking: Reported on 04/22/2015 12/05/13   Trixie DredgeEmily West, PA-C  ibuprofen (ADVIL,MOTRIN) 800 MG tablet Take 1 tablet (800 mg total) by mouth 3 (three) times daily. 04/22/15   Cheri FowlerKayla Cynthea Zachman, PA-C  oxyCODONE-acetaminophen (PERCOCET) 5-325 MG per  tablet Take 1 tablet by mouth every 4 (four) hours as needed. Patient not taking: Reported on 04/22/2015 03/27/14   Mellody DrownLauren Parker, PA-C  penicillin v potassium (VEETID) 500 MG tablet Take 1 tablet (500 mg total) by mouth 4 (four) times daily. 04/22/15 04/29/15  Cheri FowlerKayla Raghad Lorenz, PA-C  tamsulosin (FLOMAX) 0.4 MG CAPS capsule Take 1 capsule (0.4 mg total) by mouth daily. Stop medication after successful expulsion of stone. Patient not taking: Reported on 04/22/2015 03/27/14   Mellody DrownLauren Parker, PA-C   BP 131/95 mmHg  Pulse 65  Temp(Src) 97.7 F (36.5 C) (Oral)  Resp 20  SpO2 99% Physical Exam  Constitutional: He is oriented to person, place, and time. He appears well-developed and well-nourished.  HENT:  Head: Normocephalic and atraumatic.  Mouth/Throat: Uvula is midline, oropharynx is clear and moist and mucous membranes are normal. No oral lesions. No trismus in the jaw. Abnormal dentition. Dental caries present. No uvula swelling.  Eyes: Conjunctivae are normal. Pupils are equal, round, and reactive to light. No scleral icterus.  Neck: Normal range of motion. Neck supple. No tracheal deviation present.  No signs of Ludwig angina.   Cardiovascular: Normal rate, regular rhythm and normal heart sounds.   No murmur heard. Pulmonary/Chest: Effort normal and breath sounds normal. No accessory muscle usage or stridor. No respiratory distress. He has no wheezes. He has no rhonchi. He has no rales.  Abdominal: Soft. Bowel sounds are normal. He exhibits no distension. There is no tenderness.  Musculoskeletal: Normal range of motion.  Lymphadenopathy:    He  has no cervical adenopathy.  Neurological: He is alert and oriented to person, place, and time.  Mental Status:   AOx3.  Speech clear without dysarthria. Cranial Nerves:  I-not tested  II-PERRLA  III, IV, VI-EOMs intact  V-temporal and masseter strength intact  VII-symmetrical facial movements intact, no facial droop  VIII-hearing grossly intact  bilaterally  IX, X-gag intact  XI-strength of sternomastoid and trapezius muscles 5/5  XII-tongue midline Motor:   Good muscle bulk and tone  Strength 5/5 bilaterally in upper and lower extremities   Cerebellar--RAMs, finger to nose intact  Romberg--maintains balance with eyes closed  Casual and tandem gait normal without ataxia  No pronator drift Sensory:  Intact in upper and lower extremities   Skin: Skin is warm and dry.  Psychiatric: He has a normal mood and affect. His behavior is normal.    ED Course  Procedures (including critical care time) Labs Review Labs Reviewed  BASIC METABOLIC PANEL - Abnormal; Notable for the following:    Glucose, Bld 107 (*)    All other components within normal limits  CBC    Imaging Review No results found. I have personally reviewed and evaluated these images and lab results as part of my medical decision-making.   EKG Interpretation None      MDM   Final diagnoses:  Nonintractable headache, unspecified chronicity pattern, unspecified headache type  Pain, dental    Patient presents with headache and b/l upper dental pain onset 3 days ago.  No injury/trauma.  No fevers, neck stiffness.  No facial droop, unilateral weakness, slurred speech, N/V.  VSS, NAD.  On exam, heart RRR, lungs CTAB, abdomen soft and benign.  No focal neurological deficits.  No red flags.  No indication for imaging. Doubt SAH.  Doubt meningitis.  Doubt NPH.  Doubt mass lesion. Suspect primary headache and dental pain associated with poor dentition.  Possible dental infection.  Will obtain CBC and BMP.  Will give fluids and migraine cocktail.  Labs unremarkable.  Pain improved.  Patient stable for discharge.  Will d/c home with motrin and penicillin. Discussed findings with patient.  Resource guide provided to establish dental and primary care.  Discussed return precautions.  Patient agrees and acknowledges the above plan for discharge. Case has been discussed with  Dr. Criss Alvine who agrees with the above plan for discharge.      Cheri Fowler, PA-C 04/22/15 1122  Pricilla Loveless, MD 04/24/15 [phone]

## 2015-04-22 NOTE — Discharge Instructions (Signed)
Migraine Headache A migraine headache is an intense, throbbing pain on one or both sides of your head. A migraine can last for 30 minutes to several hours. CAUSES  The exact cause of a migraine headache is not always known. However, a migraine may be caused when nerves in the brain become irritated and release chemicals that cause inflammation. This causes pain. Certain things may also trigger migraines, such as:  Alcohol.  Smoking.  Stress.  Menstruation.  Aged cheeses.  Foods or drinks that contain nitrates, glutamate, aspartame, or tyramine.  Lack of sleep.  Chocolate.  Caffeine.  Hunger.  Physical exertion.  Fatigue.  Medicines used to treat chest pain (nitroglycerine), birth control pills, estrogen, and some blood pressure medicines. SIGNS AND SYMPTOMS  Pain on one or both sides of your head.  Pulsating or throbbing pain.  Severe pain that prevents daily activities.  Pain that is aggravated by any physical activity.  Nausea, vomiting, or both.  Dizziness.  Pain with exposure to bright lights, loud noises, or activity.  General sensitivity to bright lights, loud noises, or smells. Before you get a migraine, you may get warning signs that a migraine is coming (aura). An aura may include:  Seeing flashing lights.  Seeing bright spots, halos, or zigzag lines.  Having tunnel vision or blurred vision.  Having feelings of numbness or tingling.  Having trouble talking.  Having muscle weakness. DIAGNOSIS  A migraine headache is often diagnosed based on:  Symptoms.  Physical exam.  A CT scan or MRI of your head. These imaging tests cannot diagnose migraines, but they can help rule out other causes of headaches. TREATMENT Medicines may be given for pain and nausea. Medicines can also be given to help prevent recurrent migraines.  HOME CARE INSTRUCTIONS  Only take over-the-counter or prescription medicines for pain or discomfort as directed by your  health care provider. The use of long-term narcotics is not recommended.  Lie down in a dark, quiet room when you have a migraine.  Keep a journal to find out what may trigger your migraine headaches. For example, write down:  What you eat and drink.  How much sleep you get.  Any change to your diet or medicines.  Limit alcohol consumption.  Quit smoking if you smoke.  Get 7-9 hours of sleep, or as recommended by your health care provider.  Limit stress.  Keep lights dim if bright lights bother you and make your migraines worse. SEEK IMMEDIATE MEDICAL CARE IF:   Your migraine becomes severe.  You have a fever.  You have a stiff neck.  You have vision loss.  You have muscular weakness or loss of muscle control.  You start losing your balance or have trouble walking.  You feel faint or pass out.  You have severe symptoms that are different from your first symptoms. MAKE SURE YOU:   Understand these instructions.  Will watch your condition.  Will get help right away if you are not doing well or get worse.   This information is not intended to replace advice given to you by your health care provider. Make sure you discuss any questions you have with your health care provider.   Document Released: 04/26/2005 Document Revised: 05/17/2014 Document Reviewed: 01/01/2013 Elsevier Interactive Patient Education 2016 Elsevier Inc. Dental Pain    Dental pain may be caused by many things, including:  Tooth decay (cavities or caries). Cavities expose the nerve of your tooth to air and hot or cold temperatures. This  can cause pain or discomfort.  Abscess or infection. A dental abscess is a collection of infected pus from a bacterial infection in the inner part of the tooth (pulp). It usually occurs at the end of the tooth's root.  Injury.  An unknown reason (idiopathic). Your pain may be mild or severe. It may only occur when:  You are chewing.  You are exposed to hot or  cold temperature.  You are eating or drinking sugary foods or beverages, such as soda or candy. Your pain may also be constant.  HOME CARE INSTRUCTIONS  Watch your dental pain for any changes. The following actions may help to lessen any discomfort that you are feeling:  Take medicines only as directed by your dentist.  If you were prescribed an antibiotic medicine, finish all of it even if you start to feel better.  Keep all follow-up visits as directed by your dentist. This is important.  Do not apply heat to the outside of your face.  Rinse your mouth or gargle with salt water if directed by your dentist. This helps with pain and swelling.  You can make salt water by adding  tsp of salt to 1 cup of warm water. Apply ice to the painful area of your face:  Put ice in a plastic bag.  Place a towel between your skin and the bag.  Leave the ice on for 20 minutes, 2-3 times per day. Avoid foods or drinks that cause you pain, such as:  Very hot or very cold foods or drinks.  Sweet or sugary foods or drinks. SEEK MEDICAL CARE IF:  Your pain is not controlled with medicines.  Your symptoms are worse.  You have new symptoms. SEEK IMMEDIATE MEDICAL CARE IF:  You are unable to open your mouth.  You are having trouble breathing or swallowing.  You have a fever.  Your face, neck, or jaw is swollen. This information is not intended to replace advice given to you by your health care provider. Make sure you discuss any questions you have with your health care provider.  Document Released: 04/26/2005 Document Revised: 09/10/2014 Document Reviewed: 04/22/2014  Elsevier Interactive Patient Education 2016 [employer]    Emergency Department Resource Guide 1) Find a Doctor and Pay Out of Pocket Although you won't have to find out who is covered by your insurance plan, it is a good idea to ask around and get recommendations. You will then need to call the office and see if the doctor you have  chosen will accept you as a new patient and what types of options they offer for patients who are self-pay. Some doctors offer discounts or will set up payment plans for their patients who do not have insurance, but you will need to ask so you aren't surprised when you get to your appointment.  2) Contact Your Local Health Department Not all health departments have doctors that can see patients for sick visits, but many do, so it is worth a call to see if yours does. If you don't know where your local health department is, you can check in your phone book. The CDC also has a tool to help you locate your state's health department, and many state websites also have listings of all of their local health departments.  3) Find a Walk-in Clinic If your illness is not likely to be very severe or complicated, you may want to try a walk in clinic. These are popping up all over the  country in pharmacies, drugstores, and shopping centers. They're usually staffed by nurse practitioners or physician assistants that have been trained to treat common illnesses and complaints. They're usually fairly quick and inexpensive. However, if you have serious medical issues or chronic medical problems, these are probably not your best option.  No Primary Care Doctor: - Call Health Connect at  [phone] - they can help you locate a primary care doctor that  accepts your insurance, provides certain services, etc. - Physician Referral Service- [phone]  Chronic Pain Problems: Organization         Address  Phone   Notes  [facility]  [phone] Patients need to be referred by their primary care doctor.   Medication Assistance: Organization         Address  Phone   Notes  [facility] Medication [facility] [address] [place], [place] [address] [phone] --Must be a resident of [facility] -- Must have NO insurance coverage whatsoever (no Medicaid/ Medicare,  etc.) -- The pt. MUST have a primary care doctor that directs their care regularly and follows them in the community   [facility]  [phone]   [employer]  [phone]    Agencies that provide inexpensive medical care: Organization         Address  Phone   Notes  [facility]  [phone]   [facility]    [phone]   [facility] [address] [place], [place] [address] [phone]   [facility] [address] [place] [phone]   Planned Parenthood    [phone]   [facility]    [phone]   [facility]  [address], [place] Phone:  [phone], Fax:  [phone] Hours of Operation:  9 am - 6 pm, M-F.  Also accepts Medicaid/Medicare and self-pay.  [facility]  [address], [place] Phone: [phone], Fax: [phone]. Hours of Operation:  8:30 am - 5:30 pm, M-F.  Also accepts Medicaid and self-pay.  [facility] [place] [address] [place] Phone: [phone]   [facility] [address] Natasha Bence [place], [place] [phone], Ext. [phone] Mondays & Thursdays: 7-9 AM.  First 15 patients are seen on a first come, first serve basis.    Medicaid-accepting [facility] Providers:  Organization         Address  Phone   Notes  [facility] [address], [place] [phone] Also accepts self-pay patients.  [facility] [address] Laurell Josephs [place], [place]  [phone]   [facility] [address], [place] [phone]   [facility] [address] [place] [phone]   Renaye Rakers [address], [place]   [phone] Only accepts [place] Access [place] patients after they have their name applied to their card.   Self-Pay (no  insurance) in [facility]:  Organization         Address  Phone   Notes  Sickle Cell Patients, [facility] Internal Medicine [address] [place], [place] [phone]   [facility] [address] [place], [place] [phone]   [facility] [place]  [address], [place] [phone]   [facility]/Dr. Osei-Bonsu  [address] [place] or [address], [place] [phone] Phone number for both [place] and [place] locations is the same.  [facility] [place] [facility] [address], [place] [phone]   [facility] [address], [place] or [address] [phone]   [facility] [address], [place] [phone], phone; [phone], fax Sees patients 1st and 3rd Saturday of every month.  Must not qualify for public or private insurance (i.e. Medicaid, Medicare, [place] Health Choice, Veterans' Benefits)  Household income should be no more than 200% of the poverty level The clinic cannot treat you if you are pregnant or think you are pregnant  Sexually transmitted diseases are not treated at the clinic.    Dental Care: Organization         Address  Phone  Notes  [facility] [address] [facility] [address] [place], [place] [phone] Accepts children up to age 8 who are enrolled in [place] or [place] Health Choice; pregnant women with a Medicaid card; and children who have applied for Medicaid or [place] Health Choice, but were declined, whose parents can pay a reduced fee at time of service.  [facility]  [address], [place] [phone] Accepts children up to age 40 who are enrolled in [place] or [place] Health Choice; pregnant women with a Medicaid card; and children who have applied for Medicaid or [place] Health Choice, but were  declined, whose parents can pay a reduced fee at time of service.  [facility]  [address] [place], [place] [phone] Patients are seen by appointment only. Walk-ins are not accepted. [facility] will see patients 58 years of age and older. Monday - Tuesday (8am-5pm) Most Wednesdays (8:30-5pm) $30 per visit, cash only  [facility] Adult [facility] Access [facility]  [address], [facility] [phone] Patients are seen by appointment only. Walk-ins are not accepted. [facility] will see patients 79 years of age and older. One Wednesday Evening (Monthly: Volunteer Based).  $30 per visit, cash only  [employer]  [phone] for adults; Children under age 40, call Graduate Pediatric Dentistry at [phone]. Children aged 48-14, please call [phone] to request a pediatric application.  Dental services are provided in all areas of dental care including fillings, crowns and bridges, complete and partial dentures, implants, gum treatment, root canals, and extractions. Preventive care is also provided. Treatment is provided to both adults and children. Patients are selected via a lottery and there is often a waiting list.   [facility] [address], [place]  [phone] [email or web address]   [facility] [address] [place], [place] [phone], Ext. [phone] Second and Fourth Thursday of each month, opens at 6:30 AM; Clinic ends at 9 AM.  Patients are seen on a first-come first-served basis, and a limited number are seen during each clinic.   [facility]  [address] Ether Griffins [place], [place] [phone]   Eligibility Requirements You must have lived in [place], [place], or [place] counties for at least the last three months.   You cannot be eligible for state or federal sponsored [place], including [employer], [place], or [employer].   You generally  cannot be  eligible for healthcare insurance through your employer.    How to apply: Eligibility screenings are held every Tuesday and Wednesday afternoon from 1:00 pm until 4:00 pm. You do not need an appointment for the interview!  [facility] [address], [place], [place] [phone]   [facility] Department  [phone]   [facility] Health Department  [phone]   [facility] Health Department  [phone]    Behavioral Health Resources in the Community: Intensive Outpatient Programs Organization         Address  Phone  Notes  [facility] Services [address] [place], [place] [phone]   [facility] Outpatient [address], [place], [place] [phone]   [facility] [address], [place], [place]  [phone]   [facility] Mental Health [address]  [place], [place] [phone] or [phone]   Substance Abuse Resources Organization         Address  Phone  Notes  [facility]  [phone]   [facility]  [phone]   [facility]  [phone]   Floydene Flock  [phone]   Residential & Outpatient Substance Abuse Program  [phone]   Psychological Services Organization         Address  Phone  Notes  [facility] Behavioral Health  [phone]   [facility]  [phone]   [facility] Mental Health [address], [place] [phone] or [phone]    Mobile Crisis Teams Organization         Address  Phone  Notes  [facility], Mobile Crisis Care Unit  [phone]   [facility]  [address] [place], [place] [phone]   Doristine Locks [address] [place] [phone]    Self-Help/Support Groups Organization         Address  Phone             Notes  [facility] [place] - variety of support groups  [phone]- [phone] Call for more information    Narcotics Anonymous (NA), [facility] [address], [employer] [place]  2 meetings at this location   Statistician         Address  Phone  Notes  [facility] [address],    [place]  [phone]   [facility]  [address] [place] [phone], [place], [place] [phone]   [facility] [address] [place], [place] [phone] Admissions: 8am-3pm M-F  [facility] [address],    [place], [place] [phone]   [facility] [address] [place], [place], [place] [phone]   [facility] [address],  [place], [place] [phone]   [facility] - Intensive Outpatient [address] Laurell Joseph[address], [place], [place] [phone]   [facility] ([facility] [address] [place]  [place], [place] [phone] or [phone]   [facility] ([facility]) [address] [place], [place] [phone] Accepts Medicaid  [facility] [place] [address]  [place] [phone] Substance Abuse/Addiction Treatment   [facility] Organization         Address  Phone  Notes  [facility]  [phone]   Angie Fava, PhD [address] [place], [place]   [phone] or [phone]   [facility]   [phone]  [address] [place], [place] [phone]   [facility] [address] [place], [place] [phone] Insurance/Medicaid/sponsorship through [facility] and Families [address]                                    [place], [place] [phone] Therapy/tele-psych/case  [facility] [address]   [place], [place] [phone]    Dr. Lolly Mustache  [phone]   [facility]  [employer] [facility] Dept. 1) [address] [place] 2) [address], [place] 3)  [address], [place] [phone]  [phone]   [facility] Child Abuse Hotline 470-073-1341 or [phone] (After Hours)

## 2015-04-22 NOTE — ED Notes (Signed)
Pt watching youtube videos.

## 2015-04-22 NOTE — ED Notes (Signed)
PT states pain in Head, teeth and gums for 3 days. Pt states sore throat with pain 8/10.  Denies N/V/D.  Denies chest pain or SOB.

## 2015-06-12 IMAGING — CT CT RENAL STONE PROTOCOL
1 series · 14 of 17 positions shown, 19 images · non-contrast
Comparison: 03/27/2011

CLINICAL DATA: Right flank pain.

EXAM:
CT ABDOMEN AND PELVIS WITHOUT CONTRAST
TECHNIQUE: Multidetector CT imaging of the abdomen and pelvis was performed
following the standard protocol without IV contrast.

[Series 3: lung · axial · 0.68mm/px · z∈[+1364,+1434]mm · 14 of 17 slices shown, 19 images]
[im 2/17  soft-tissue]
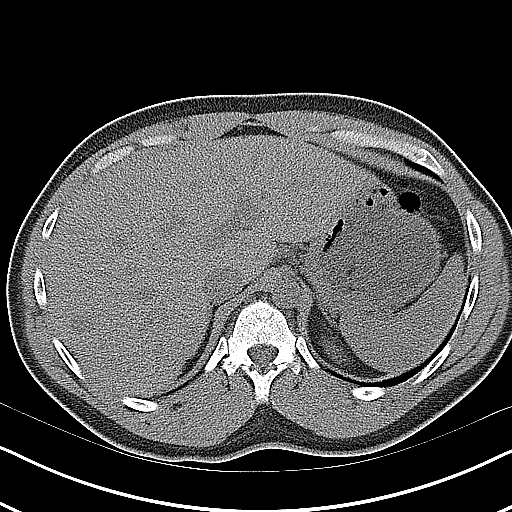
[im 2/17  bone]
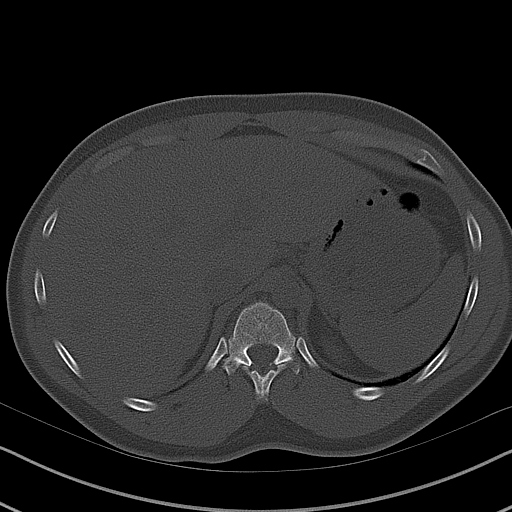
[im 3/17  soft-tissue]
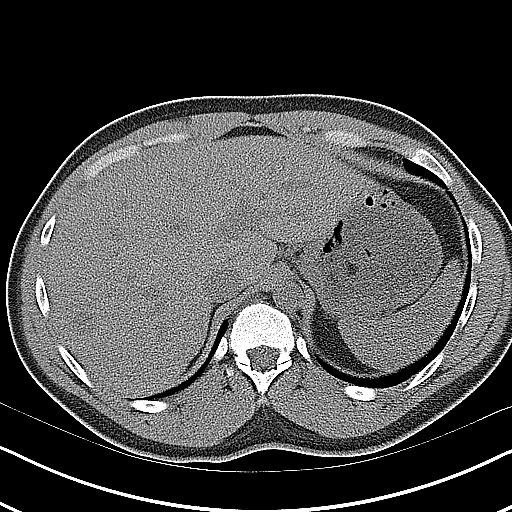
[im 4/17  soft-tissue]
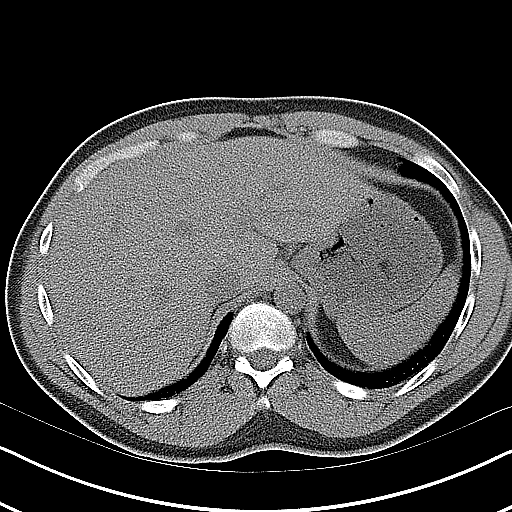
[im 6/17  soft-tissue]
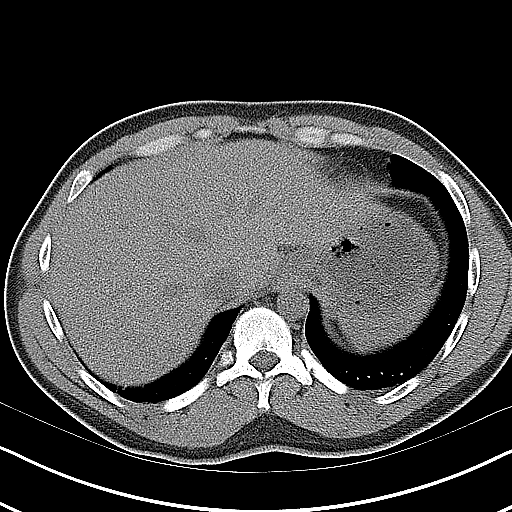
[im 7/17  soft-tissue]
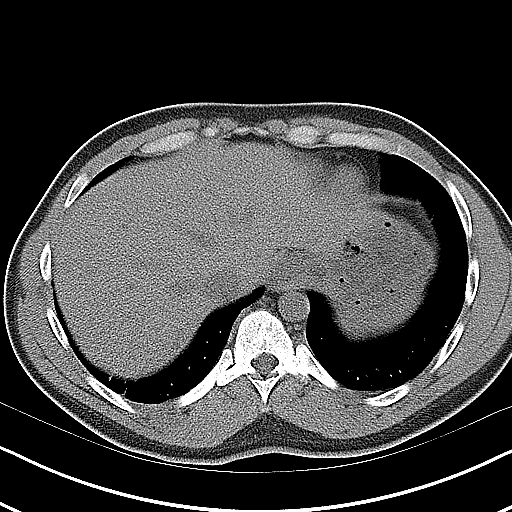
[im 8/17  soft-tissue]
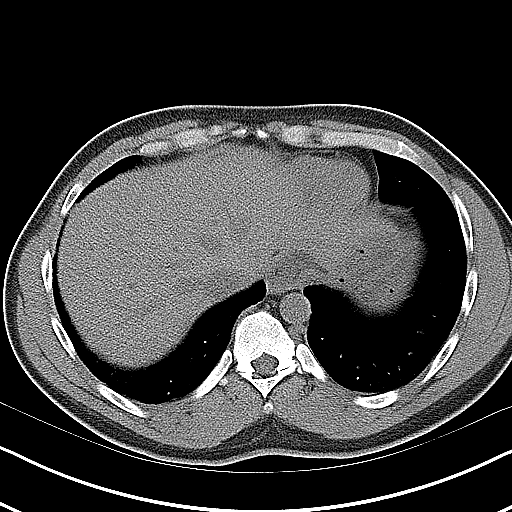
[im 9/17  soft-tissue]
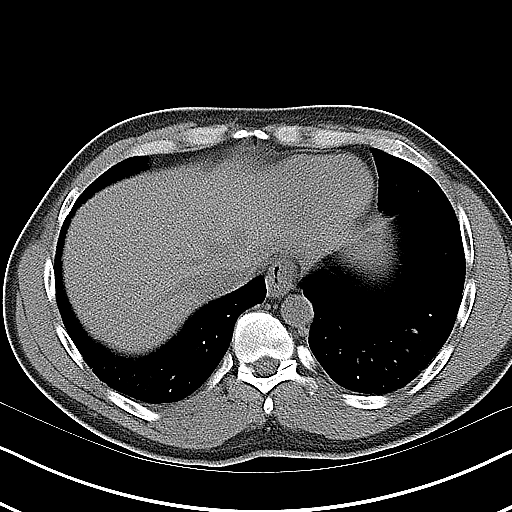
[im 10/17  soft-tissue]
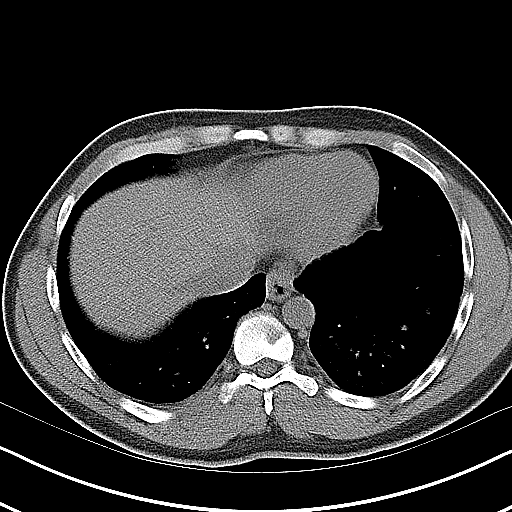
[im 11/17  soft-tissue]
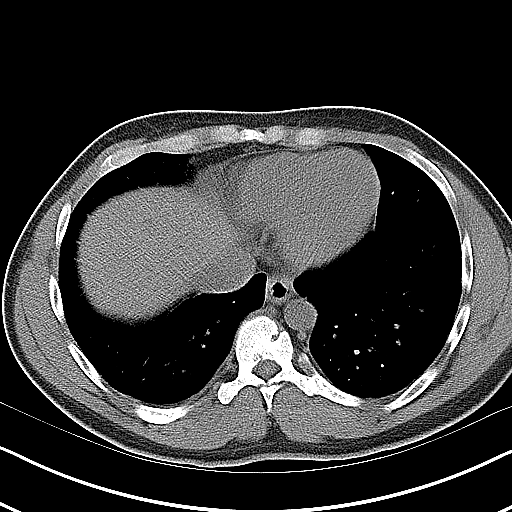
[im 11/17  bone]
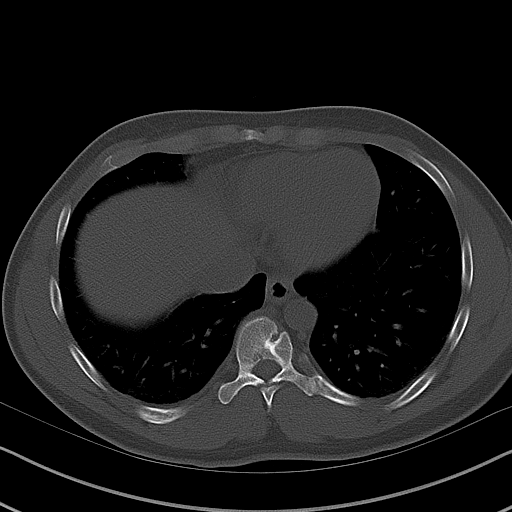
[im 12/17  soft-tissue]
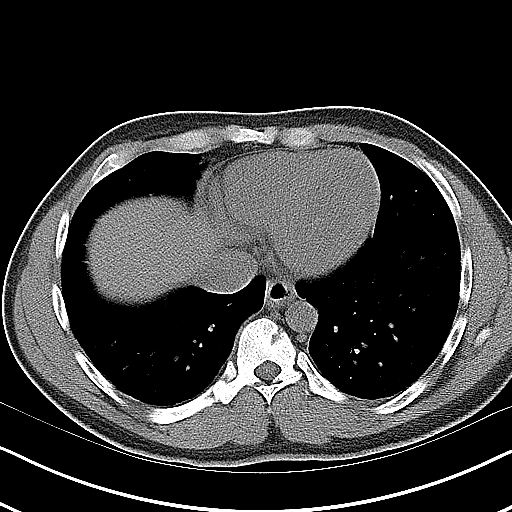
[im 13/17  lung]
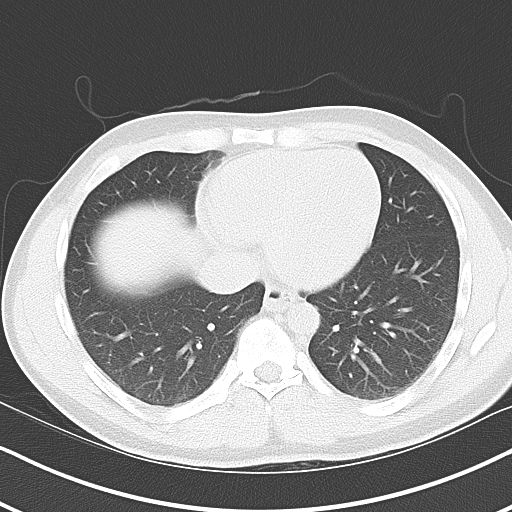
[im 14/17  soft-tissue]
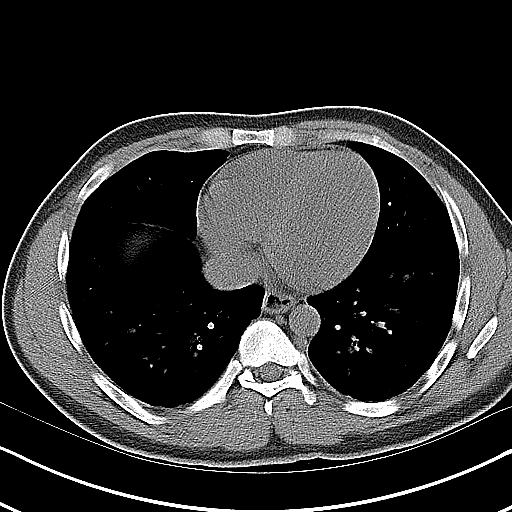
[im 14/17  lung]
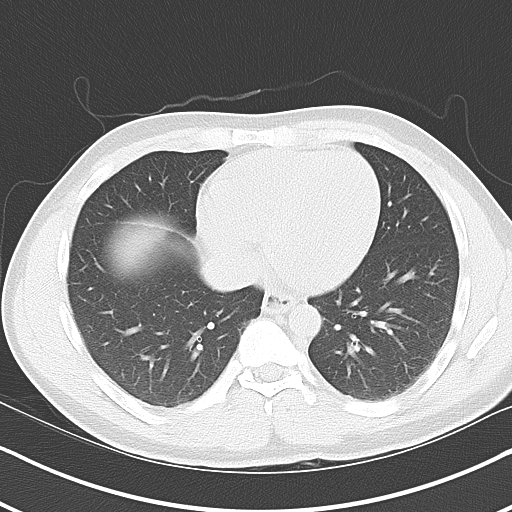
[im 15/17  soft-tissue]
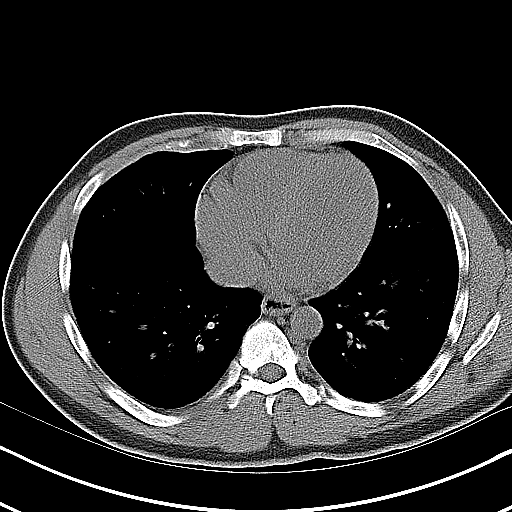
[im 15/17  lung]
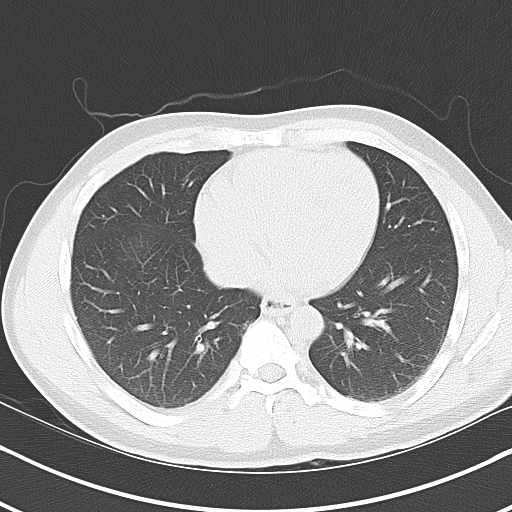
[im 16/17  soft-tissue]
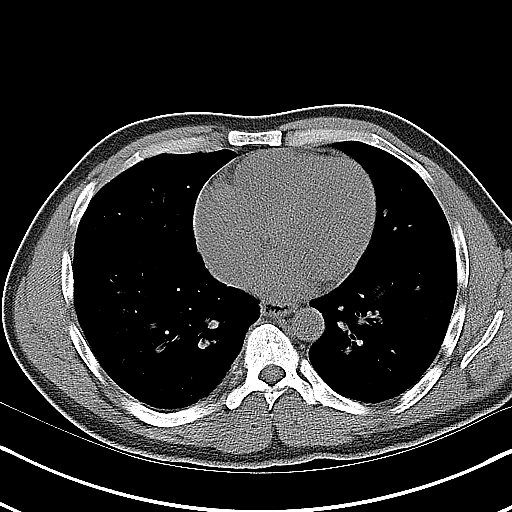
[im 16/17  lung]
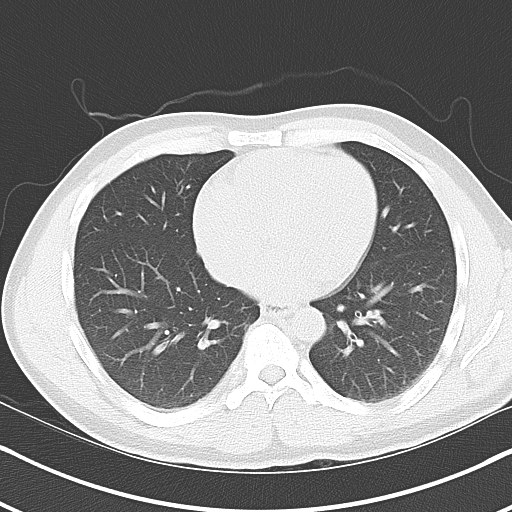

[14 of 17 positions shown; findings below may reference images not displayed]

FINDINGS: There is a 4 mm stone in the right ureter up approximately 5 cm
proximal to the ureterovesical junction creating slight right
hydronephrosis. There is a 3 mm stone in the upper pole of the
otherwise normal left kidney.

There is a 15 mm cyst in the anterior aspect of the left lobe of the
liver, increased in size since the prior study. There are 2
low-density lesions in the liver, 16 mm in the posterior aspect of
the right lobe and 19 mm in the lateral aspect of the left lobe
anteriorly. These have increased in size since 8618 and probably
represent benign hemangiomata.

The liver is otherwise normal. Biliary tree, spleen, pancreas,
adrenal glands, and bowel appear normal including the terminal ileum
and appendix.

No adenopathy or free air free fluid.

Bladder and prostate gland appear normal. No acute osseous
abnormality. Congenital butterfly vertebra at T10.
IMPRESSION: 1. 4 mm stone in the distal right ureter creating mild right
hydronephrosis.
2. Multiple liver lesions, all increased in size since 8618. I think
this represents a combination of hemangiomata and a benign liver
cyst. The slow slight growth of the lesions is consistent with
benign disease. This could be confirmed by a liver ultrasound.
3. Tiny stone in the upper pole of the otherwise normal left hip.

## 2024-04-24 ENCOUNTER — Emergency Department (HOSPITAL_COMMUNITY): Payer: Self-pay

## 2024-04-24 ENCOUNTER — Encounter (HOSPITAL_COMMUNITY): Payer: Self-pay | Admitting: Emergency Medicine

## 2024-04-24 ENCOUNTER — Other Ambulatory Visit: Payer: Self-pay

## 2024-04-24 ENCOUNTER — Emergency Department (HOSPITAL_COMMUNITY)
Admission: EM | Admit: 2024-04-24 | Discharge: 2024-04-24 | Disposition: A | Discharge status: Home / Self Care | Payer: Self-pay | Attending: Emergency Medicine | Admitting: Emergency Medicine

## 2024-04-24 DIAGNOSIS — R55 Syncope and collapse: Secondary | ICD-10-CM

## 2024-04-24 LAB — COMPREHENSIVE METABOLIC PANEL WITH GFR
ALT: 16 U/L (ref 0–44)
AST: 25 U/L (ref 15–41)
Albumin: 3.7 g/dL (ref 3.5–5.0)
Alkaline Phosphatase: 52 U/L (ref 38–126)
Anion gap: 11 (ref 5–15)
BUN: 14 mg/dL (ref 6–20)
CO2: 21 mmol/L — ABNORMAL LOW (ref 22–32)
Calcium: 8.9 mg/dL (ref 8.9–10.3)
Chloride: 107 mmol/L (ref 98–111)
Creatinine, Ser: 1.29 mg/dL — ABNORMAL HIGH (ref 0.61–1.24)
GFR, Estimated: >60 mL/min (ref >60)
Glucose, Bld: 107 mg/dL — ABNORMAL HIGH (ref 70–99)
Potassium: 4.4 mmol/L (ref 3.5–5.1)
Sodium: 138 mmol/L (ref 135–145)
Total Bilirubin: 0.7 mg/dL (ref 0.0–1.2)
Total Protein: 5.8 g/dL — ABNORMAL LOW (ref 6.5–8.1)

## 2024-04-24 LAB — URINALYSIS, ROUTINE W REFLEX MICROSCOPIC
Glucose, UA: NEGATIVE mg/dL
Hgb urine dipstick: NEGATIVE
Ketones, ur: 15 mg/dL — AB
Leukocytes,Ua: NEGATIVE
Nitrite: NEGATIVE
Protein, ur: NEGATIVE mg/dL
Specific Gravity, Urine: >1.03 — ABNORMAL HIGH (ref 1.005–1.030)
pH: 6 (ref 5.0–8.0)

## 2024-04-24 LAB — CBG MONITORING, ED: Glucose-Capillary: 72 mg/dL (ref 70–99)

## 2024-04-24 LAB — CBC
HCT: 46.2 % (ref 39.0–52.0)
Hemoglobin: 15.8 g/dL (ref 13.0–17.0)
MCH: 27.4 pg (ref 26.0–34.0)
MCHC: 34.2 g/dL (ref 30.0–36.0)
MCV: 80.2 fL (ref 80.0–100.0)
Platelets: 258 K/uL (ref 150–400)
RBC: 5.76 MIL/uL (ref 4.22–5.81)
RDW: 13.4 % (ref 11.5–15.5)
WBC: 7 K/uL (ref 4.0–10.5)
nRBC: 0 % (ref 0.0–0.2)

## 2024-04-24 LAB — TROPONIN T, HIGH SENSITIVITY: Troponin T High Sensitivity: <15 ng/L (ref 0–19)

## 2024-04-24 MED ORDER — SODIUM CHLORIDE 0.9 % IV BOLUS
1000.0000 mL | Freq: Once | INTRAVENOUS | Status: AC
  Administered 2024-04-24: 17:00:00 1000 mL via INTRAVENOUS

## 2024-04-24 MED ORDER — SODIUM CHLORIDE 0.9 % IV BOLUS
1000.0000 mL | Freq: Once | INTRAVENOUS | Status: AC
  Administered 2024-04-24: 19:00:00 1000 mL via INTRAVENOUS

## 2024-04-24 MED ORDER — IOHEXOL 350 MG/ML SOLN
75.0000 mL | Freq: Once | INTRAVENOUS | Status: AC | PRN
  Administered 2024-04-24: 18:00:00 75 mL via INTRAVENOUS

## 2024-04-24 NOTE — ED Provider Triage Note (Signed)
 Emergency Medicine Provider Triage Evaluation Note  Marcus Hardy , a 48 y.o. male  was evaluated in triage.  Pt complains of chest pain, shortness of breath and syncope.  The patient donated plasma earlier today.  He was sitting on the couch and stood up and he felt acutely lightheaded with associated shortness of breath and some chest discomfort.  He then syncopized and struck his head.  He is now having a headache along the left temple.  He denies any active chest pain at this time.  Review of Systems  Positive: Chest pain, shortness of breath, syncope, headache Negative: Abdominal pain  Physical Exam  BP 98/66 (BP Location: Right Arm)   Pulse 83   Temp 97.7 F (36.5 C)   Resp (!) 21   SpO2 100%  Gen:   Awake, no distress   HEENT: Hematoma to the left temple, underlying tenderness, no midline tenderness of the cervical spine Resp:  Normal effort, lungs CTAB CV:  Well-perfused MSK:   Moves extremities without difficulty without evidence of extremity trauma  Medical Decision Making  Medically screening exam initiated at 4:11 PM.  Appropriate orders placed.  Mariana Goytia was informed that the remainder of the evaluation will be completed by another provider, this initial triage assessment does not replace that evaluation, and the importance of remaining in the ED until their evaluation is complete.  Workup initiated to include EKG, labs and CT imaging.   Jerrol Agent, MD 04/24/24 [phone]

## 2024-04-24 NOTE — ED Notes (Signed)
 Mother Leander Tout [phone] would like an update asap

## 2024-04-24 NOTE — ED Notes (Signed)
 Pt ambulatory at time of discharge. Pt verbalized understanding of discharge instructions.

## 2024-04-24 NOTE — ED Triage Notes (Signed)
 Pt BIB EMS for syncope that happened 30 mins ago after getting home from giving plasm. Hit head on back of couch. No thinners. Fall was witnessed. NO Loc. Complains of dizziness and lightedheaded  EMS vitals  90/70 initially now 140/60  160 cbg  90 hr 98% ra 20 lac 250cc ns

## 2024-04-24 NOTE — ED Provider Notes (Signed)
 [place] [facility] AT [facility] Provider Note   CSN: [id number] Arrival date & time: 04/24/24  1551     Patient presents with: Loss of Consciousness   Marcus Hardy is a 48 y.o. male.   48 y.o male with a PMH of HTN on norvas presents to the ED s/p syncope. Patient reports he was sitting down when suddenly he began to feel somewhat lightheaded, reports that he woke up on the ground.  He is unsure how long he was down for but it was a couple of seconds per bystanders.  He reports he donated blood this morning approximately 1 L.  Did have some shortness of breath associated with the syncopal episode.  Now has a headache as he did strike the left side of his head on the ground with a small bruise noted.  He reports multiple times of prior donation of blood however this has never occurred.  He denies any chest pain at this time, no shortness of breath, no dizziness or lightheadedness.  The history is provided by the patient.  Loss of Consciousness Episode history:  Single Most recent episode:  Today Duration:  5 seconds Timing:  Constant Progression:  Resolved Chronicity:  New Context: normal activity   Witnessed: yes   Relieved by:  Nothing Worsened by:  Nothing Associated symptoms: shortness of breath   Associated symptoms: no chest pain and no fever        Prior to Admission medications  Medication Sig Start Date End Date Taking? Authorizing Provider  HYDROcodone -acetaminophen  (NORCO/VICODIN) 5-325 MG per tablet Take 1-2 tablets by mouth every 4 (four) hours as needed for moderate pain or severe pain. Patient not taking: Reported on 04/22/2015 12/05/13   Devora Perkins, PA-C  ibuprofen  (ADVIL ,MOTRIN ) 800 MG tablet Take 1 tablet (800 mg total) by mouth 3 (three) times daily. 04/22/15   Rose, Kayla, PA-C  oxyCODONE -acetaminophen  (PERCOCET) 5-325 MG per tablet Take 1 tablet by mouth every 4 (four) hours as needed. Patient not taking: Reported on 04/22/2015  03/27/14   Kennyth Maxwell, PA-C  tamsulosin  (FLOMAX ) 0.4 MG CAPS capsule Take 1 capsule (0.4 mg total) by mouth daily. Stop medication after successful expulsion of stone. Patient not taking: Reported on 04/22/2015 03/27/14   Kennyth Maxwell, PA-C    Allergies: Vicodin [hydrocodone -acetaminophen ]    Review of Systems  Constitutional:  Negative for chills and fever.  Respiratory:  Positive for shortness of breath.   Cardiovascular:  Positive for syncope. Negative for chest pain.  Neurological:  Positive for syncope.  All other systems reviewed and are negative.   Updated Vital Signs BP 138/77 (BP Location: Right Arm)   Pulse 83   Temp 97.7 F (36.5 C) (Oral)   Resp 20   SpO2 99%   Physical Exam Vitals and nursing note reviewed.  Constitutional:      Appearance: He is well-developed.  HENT:     Head: Normocephalic and atraumatic.  Eyes:     General: No scleral icterus.    Pupils: Pupils are equal, round, and reactive to light.  Cardiovascular:     Heart sounds: Normal heart sounds.  Pulmonary:     Effort: Pulmonary effort is normal.     Breath sounds: Normal breath sounds. No wheezing.  Chest:     Chest wall: No tenderness.  Abdominal:     General: Bowel sounds are normal. There is no distension.     Palpations: Abdomen is soft.     Tenderness: There is no  abdominal tenderness.  Musculoskeletal:        General: No tenderness or deformity.     Cervical back: Normal range of motion.  Skin:    General: Skin is warm and dry.  Neurological:     Mental Status: He is alert and oriented to person, place, and time.     (all labs ordered are listed, but only abnormal results are displayed) Labs Reviewed  COMPREHENSIVE METABOLIC PANEL WITH GFR - Abnormal; Notable for the following components:      Result Value   CO2 21 (*)    Glucose, Bld 107 (*)    Creatinine, Ser 1.29 (*)    Total Protein 5.8 (*)    All other components within normal limits  URINALYSIS, ROUTINE W  REFLEX MICROSCOPIC - Abnormal; Notable for the following components:   Color, Urine AMBER (*)    Specific Gravity, Urine >1.030 (*)    Bilirubin Urine SMALL (*)    Ketones, ur 15 (*)    All other components within normal limits  CBC  CBG MONITORING, ED  TROPONIN T, HIGH SENSITIVITY    EKG: EKG Interpretation Date/Time:  Tuesday April 24 2024 17:01:26 EST Ventricular Rate:  66 PR Interval:  138 QRS Duration:  98 QT Interval:  372 QTC Calculation: 389 R Axis:   53  Text Interpretation: Normal sinus rhythm Septal infarct , age undetermined Cannot rule out Inferior infarct , age undetermined ST & T wave abnormality, consider lateral ischemia Abnormal ECG When compared with ECG of 24-Apr-2024 16:08, No significant change since last tracing Confirmed by Yolande Charleston [phone]) on 04/24/2024 7:04:53 PM  Radiology: CT Head Wo Contrast Result Date: 04/24/2024 EXAM: CT HEAD WITHOUT CONTRAST 04/24/2024 06:02:37 PM TECHNIQUE: CT of the head was performed without the administration of intravenous contrast. Automated exposure control, iterative reconstruction, and/or weight based adjustment of the mA/kV was utilized to reduce the radiation dose to as low as reasonably achievable. COMPARISON: 05/25/2010 CLINICAL HISTORY: Head trauma, moderate-severe. FINDINGS: BRAIN AND VENTRICLES: No acute hemorrhage. No evidence of acute infarct. No hydrocephalus. No extra-axial collection. No mass effect or midline shift. ORBITS: No acute abnormality. SINUSES: Mild mucosal disease within maxillary sinuses. SOFT TISSUES AND SKULL: Focal metallic foreign body in the soft tissues just superior to the left external auditory canal. No skull fracture. IMPRESSION: 1. No acute intracranial abnormality. 2. Focal metallic foreign body in the soft tissues just superior to the left external auditory canal. Electronically signed by: Donnice Mania MD 04/24/2024 06:39 PM EST RP Workstation: [id number]   CT Angio Chest PE W  and/or Wo Contrast Result Date: 04/24/2024 EXAM: CTA of the Chest with contrast for PE 04/24/2024 06:02:37 PM TECHNIQUE: CTA of the chest was performed after the administration of intravenous contrast. Multiplanar reformatted images are provided for review. MIP images are provided for review. Automated exposure control, iterative reconstruction, and/or weight based adjustment of the mA/kV was utilized to reduce the radiation dose to as low as reasonably achievable. COMPARISON: None available. CLINICAL HISTORY: Pulmonary embolism (PE) suspected, high prob FINDINGS: PULMONARY ARTERIES: Pulmonary arteries are adequately opacified for evaluation. No pulmonary embolism. Main pulmonary artery is normal in caliber. MEDIASTINUM: The heart and pericardium demonstrate no acute abnormality. There is no acute abnormality of the thoracic aorta. LYMPH NODES: No mediastinal, hilar or axillary lymphadenopathy. LUNGS AND PLEURA: The lungs are without acute process. No focal consolidation or pulmonary edema. No pleural effusion or pneumothorax. UPPER ABDOMEN: Limited images of the upper abdomen are unremarkable. SOFT TISSUES AND  BONES: No acute bone or soft tissue abnormality. T10 vertebral body congenital anomaly present. IMPRESSION: 1. No pulmonary embolism or acute pulmonary abnormality. Electronically signed by: Greig Pique MD 04/24/2024 06:08 PM EST RP Workstation: [id number]   DG Chest Portable 1 View Result Date: 04/24/2024 CLINICAL DATA:  syncope EXAM: DG CHEST 1V PORT COMPARISON:  None available. FINDINGS: Lower lung volumes. No focal airspace consolidation, pleural effusion, or pneumothorax. No cardiomegaly.No acute fracture or destructive lesion. IMPRESSION: Low lung volumes.  Otherwise, no acute cardiopulmonary abnormality. Electronically Signed   By: Rogelia Myers M.D.   On: 04/24/2024 16:39     Procedures   Medications Ordered in the ED  sodium chloride  0.9 % bolus 1,000 mL (0 mLs Intravenous Stopped  04/24/24 1908)  iohexol  (OMNIPAQUE ) 350 MG/ML injection 75 mL (75 mLs Intravenous Contrast Given 04/24/24 1803)  sodium chloride  0.9 % bolus 1,000 mL (0 mLs Intravenous Stopped 04/24/24 2107)                                    Medical Decision Making Amount and/or Complexity of Data Reviewed Labs: ordered.   This patient presents to the ED for concern of syncope, this involves a number of treatment options, and is a complaint that carries with it a high risk of complications and morbidity.  The differential diagnosis includes vasovagal, hypovolemia, versus sepsis.    Co morbidities: Discussed in HPI   Brief History:  See HPI.   EMR reviewed including pt PMHx, past surgical history and past visits to ER.   See HPI for more details   Lab Tests:  I ordered and independently interpreted labs.  The pertinent results include:    I personally reviewed all laboratory work and imaging. Metabolic panel without any acute abnormality specifically kidney function within normal limits and no significant electrolyte abnormalities. CBC without leukocytosis or significant anemia. UA with no signs of infection. Troponin x 2 is within normal limits.    Imaging Studies:  CT head without any acute findings.  CTA chest abdomen and pelvis is negative Dg chest is normal   Cardiac Monitoring:  The patient was maintained on a cardiac monitor.  I personally viewed and interpreted the cardiac monitored which showed an underlying rhythm of: NSR EKG non-ischemic   Medicines ordered:  I ordered medication including bolus x 2  for low blood pressure  Reevaluation of the patient after these medicines showed that the patient improved I have reviewed the patients home medicines and have made adjustments as needed  Reevaluation:  After the interventions noted above I re-evaluated patient and found that they have :improved  Social Determinants of Health:  The patient's social determinants of  health were a factor in the care of this patient   Problem List / ED Course:  Patient presents to the ED with a chief complaint of syncope this morning. States he was sitting down when suddenly he felt like he was going to collapse then woke up on the ground. Episode was witness was on the ground for a couple of seconds. Did hit the left side of his head on the floor.Not on any blood thinners. Neuro exam is benign. He does have some soft blood pressures but blood work is unremarkable.  CBC with no leukocytosis, hemoglobin within normal limits.  CMP without any Electra derangement.  Creatinine levels unremarkable.  LFTs are within normal limits.  Glucose level was normal.  He did have some soft pressures while in the emergency department.  EKG is normal sinus rhythm and troponin x 2 are negative.  Not having any chest pain or shortness of breath at this time.  His blood pressure did improve after 2 L of normal saline.  He is tolerating p.o. adequately, no nausea or vomiting noted.  Suspect likely hypovolemia.  Blood work unremarkable along with a reassuring hemoglobin.  He is hemodynamically stable for discharge.   Dispostion:  After consideration of the diagnostic results and the patients response to treatment, I feel that the patent would benefit from continue hydration follow up with PCP.      Portions of this note were generated with Scientist, clinical (histocompatibility and immunogenetics). Dictation errors may occur despite best attempts at proofreading.   Final diagnoses:  Syncope and collapse    ED Discharge Orders     None          Maureen Broad, PA-C 04/24/24 2230    Yolande Lamar BROCKS, MD 04/26/24 [phone]

## 2024-04-24 NOTE — Discharge Instructions (Addendum)
 Your laboratory results are within normal limits.  Please follow-up with your primary care physician as needed.
